# Patient Record
Sex: Male | Born: 1965 | Race: White | Hispanic: No | Marital: Married | Smoking: Former smoker
Health system: Southern US, Community
[De-identification: ages and names within clinical notes are randomized; demographics above are authoritative.]

## PROBLEM LIST (undated history)

## (undated) DIAGNOSIS — I10 Essential (primary) hypertension: Secondary | ICD-10-CM

## (undated) DIAGNOSIS — D649 Anemia, unspecified: Secondary | ICD-10-CM

## (undated) DIAGNOSIS — K7400 Hepatic fibrosis, unspecified: Secondary | ICD-10-CM

## (undated) DIAGNOSIS — I2510 Atherosclerotic heart disease of native coronary artery without angina pectoris: Secondary | ICD-10-CM

## (undated) HISTORY — PX: OTHER SURGICAL HISTORY: SHX169

## (undated) HISTORY — PX: TONSILLECTOMY: SUR1361

## (undated) HISTORY — PX: WHIPPLE PROCEDURE: SHX2667

## (undated) HISTORY — PX: COLONOSCOPY: SHX174

---

## 2007-03-02 ENCOUNTER — Emergency Department (HOSPITAL_COMMUNITY): Admission: EM | Admit: 2007-03-02 | Discharge: 2007-03-02 | Payer: Self-pay | Admitting: Emergency Medicine

## 2012-10-08 ENCOUNTER — Other Ambulatory Visit: Payer: Self-pay | Admitting: Internal Medicine

## 2012-10-08 DIAGNOSIS — R748 Abnormal levels of other serum enzymes: Secondary | ICD-10-CM

## 2012-10-22 ENCOUNTER — Ambulatory Visit
Admission: RE | Admit: 2012-10-22 | Discharge: 2012-10-22 | Disposition: A | Discharge status: Home / Self Care | Payer: No Typology Code available for payment source | Source: Ambulatory Visit | Attending: Internal Medicine | Admitting: Internal Medicine

## 2012-10-22 DIAGNOSIS — R748 Abnormal levels of other serum enzymes: Secondary | ICD-10-CM

## 2012-10-31 ENCOUNTER — Other Ambulatory Visit: Payer: Self-pay | Admitting: Gastroenterology

## 2012-10-31 ENCOUNTER — Encounter (HOSPITAL_COMMUNITY): Payer: Self-pay | Admitting: *Deleted

## 2012-10-31 ENCOUNTER — Encounter (HOSPITAL_COMMUNITY): Payer: Self-pay | Admitting: Pharmacy Technician

## 2012-10-31 NOTE — Progress Notes (Signed)
Lab work from [facility]-10/02/2012-CMET,CBC,LIPID,A1C, MICROALBUMIN/CREATININE,U/A:  10/08/2012 HEPA AB,IGM,HEP A AB,TOTAL.  04/27/2010-Stress test from [facility] 04/09/2010- Echocardiogram from [facility]    ALL ON CHART!

## 2012-11-02 ENCOUNTER — Ambulatory Visit (HOSPITAL_COMMUNITY): Payer: No Typology Code available for payment source

## 2012-11-02 ENCOUNTER — Encounter (HOSPITAL_COMMUNITY): Payer: Self-pay | Admitting: Anesthesiology

## 2012-11-02 ENCOUNTER — Ambulatory Visit (HOSPITAL_COMMUNITY): Payer: No Typology Code available for payment source | Admitting: Anesthesiology

## 2012-11-02 ENCOUNTER — Encounter (HOSPITAL_COMMUNITY)
Admission: RE | Disposition: A | Discharge status: Home / Self Care | Payer: Self-pay | Source: Ambulatory Visit | Attending: Gastroenterology

## 2012-11-02 ENCOUNTER — Ambulatory Visit (HOSPITAL_COMMUNITY)
Admission: RE | Admit: 2012-11-02 | Discharge: 2012-11-02 | Disposition: A | Discharge status: Home / Self Care | Payer: No Typology Code available for payment source | Source: Ambulatory Visit | Attending: Gastroenterology | Admitting: Gastroenterology

## 2012-11-02 ENCOUNTER — Encounter (HOSPITAL_COMMUNITY): Payer: Self-pay

## 2012-11-02 DIAGNOSIS — I2510 Atherosclerotic heart disease of native coronary artery without angina pectoris: Secondary | ICD-10-CM | POA: Insufficient documentation

## 2012-11-02 DIAGNOSIS — C250 Malignant neoplasm of head of pancreas: Secondary | ICD-10-CM

## 2012-11-02 DIAGNOSIS — I10 Essential (primary) hypertension: Secondary | ICD-10-CM | POA: Insufficient documentation

## 2012-11-02 DIAGNOSIS — E119 Type 2 diabetes mellitus without complications: Secondary | ICD-10-CM | POA: Insufficient documentation

## 2012-11-02 DIAGNOSIS — K838 Other specified diseases of biliary tract: Secondary | ICD-10-CM | POA: Insufficient documentation

## 2012-11-02 DIAGNOSIS — K869 Disease of pancreas, unspecified: Secondary | ICD-10-CM | POA: Insufficient documentation

## 2012-11-02 DIAGNOSIS — R748 Abnormal levels of other serum enzymes: Secondary | ICD-10-CM | POA: Insufficient documentation

## 2012-11-02 HISTORY — DX: Anemia, unspecified: D64.9

## 2012-11-02 HISTORY — DX: Essential (primary) hypertension: I10

## 2012-11-02 HISTORY — DX: Atherosclerotic heart disease of native coronary artery without angina pectoris: I25.10

## 2012-11-02 HISTORY — PX: EUS: SHX5427

## 2012-11-02 HISTORY — PX: ERCP: SHX5425

## 2012-11-02 LAB — GLUCOSE, CAPILLARY: Glucose-Capillary: 204 mg/dL — ABNORMAL HIGH (ref 70–99)

## 2012-11-02 SURGERY — UPPER ENDOSCOPIC ULTRASOUND (EUS) LINEAR
Anesthesia: General

## 2012-11-02 MED ORDER — ONDANSETRON HCL 4 MG/2ML IJ SOLN: 4.0000 mg | Freq: Once | INTRAMUSCULAR | Status: DC | PRN

## 2012-11-02 MED ORDER — SUCCINYLCHOLINE CHLORIDE 20 MG/ML IJ SOLN
INTRAMUSCULAR | Status: DC | PRN
  Administered 2012-11-02: 140 mg via INTRAVENOUS

## 2012-11-02 MED ORDER — FENTANYL CITRATE 0.05 MG/ML IJ SOLN
INTRAMUSCULAR | Status: DC | PRN
  Administered 2012-11-02 (×2): 50 ug via INTRAVENOUS
  Administered 2012-11-02 (×2): 25 ug via INTRAVENOUS

## 2012-11-02 MED ORDER — MIDAZOLAM HCL 5 MG/5ML IJ SOLN
INTRAMUSCULAR | Status: DC | PRN
  Administered 2012-11-02 (×2): 0.5 mg via INTRAVENOUS
  Administered 2012-11-02: 1 mg via INTRAVENOUS

## 2012-11-02 MED ORDER — IOHEXOL 300 MG/ML  SOLN
100.0000 mL | Freq: Once | INTRAMUSCULAR | Status: AC | PRN
  Administered 2012-11-02: 100 mL via INTRAVENOUS

## 2012-11-02 MED ORDER — FENTANYL CITRATE 0.05 MG/ML IJ SOLN
25.0000 ug | Freq: Once | INTRAMUSCULAR | Status: AC
  Administered 2012-11-02: 25 ug via INTRAVENOUS

## 2012-11-02 MED ORDER — LIDOCAINE HCL (CARDIAC) 20 MG/ML IV SOLN
INTRAVENOUS | Status: DC | PRN
  Administered 2012-11-02: 30 mg via INTRAVENOUS

## 2012-11-02 MED ORDER — SUCCINYLCHOLINE CHLORIDE 20 MG/ML IJ SOLN: INTRAMUSCULAR | Status: DC | PRN

## 2012-11-02 MED ORDER — LACTATED RINGERS IV SOLN
INTRAVENOUS | Status: DC
  Administered 2012-11-02: 1000 mL via INTRAVENOUS

## 2012-11-02 MED ORDER — FENTANYL CITRATE 0.05 MG/ML IJ SOLN
INTRAMUSCULAR | Status: AC
  Filled 2012-11-02: qty 2

## 2012-11-02 MED ORDER — ONDANSETRON HCL 4 MG/2ML IJ SOLN
INTRAMUSCULAR | Status: DC | PRN
  Administered 2012-11-02: 4 mg via INTRAVENOUS

## 2012-11-02 MED ORDER — PROPOFOL INFUSION 10 MG/ML OPTIME
INTRAVENOUS | Status: DC | PRN
  Administered 2012-11-02: 200 mL via INTRAVENOUS
  Administered 2012-11-02 (×3): 10 mL via INTRAVENOUS

## 2012-11-02 MED ORDER — SODIUM CHLORIDE 0.9 % IV SOLN: INTRAVENOUS | Status: DC

## 2012-11-02 MED ORDER — KETAMINE HCL 10 MG/ML IJ SOLN
INTRAMUSCULAR | Status: DC | PRN
  Administered 2012-11-02 (×2): 10 mg via INTRAVENOUS

## 2012-11-02 MED ORDER — LACTATED RINGERS IV SOLN
INTRAVENOUS | Status: DC | PRN
  Administered 2012-11-02 (×2): via INTRAVENOUS

## 2012-11-02 MED ORDER — MEPERIDINE HCL 100 MG/ML IJ SOLN: 6.2500 mg | INTRAMUSCULAR | Status: DC | PRN

## 2012-11-02 MED ORDER — SODIUM CHLORIDE 0.9 % IV SOLN
INTRAVENOUS | Status: DC | PRN
  Administered 2012-11-02: 11:00:00

## 2012-11-02 MED ORDER — DEXAMETHASONE SODIUM PHOSPHATE 4 MG/ML IJ SOLN
INTRAMUSCULAR | Status: DC | PRN
  Administered 2012-11-02: 10 mg via INTRAVENOUS

## 2012-11-02 NOTE — Progress Notes (Signed)
Patient back from Ct Scan. Dr. Elnoria Howard aware. Per Dr. Elnoria Howard patient is ok to go home. Patient is requesting to wait until CT results are back before going home. Dr. Elnoria Howard notified.

## 2012-11-02 NOTE — Progress Notes (Signed)
Pt has gone to CT scan. Will return upon CT completion

## 2012-11-02 NOTE — Anesthesia Preprocedure Evaluation (Addendum)
Anesthesia Evaluation  Patient identified by MRN, date of birth, ID band Patient awake    Reviewed: Allergy & Precautions, H&P , NPO status , Patient's Chart, lab work & pertinent test results  Airway Mallampati: II TM Distance: >3 FB Neck ROM: Full    Dental  (+) Dental Advisory Given and Teeth Intact   Pulmonary former smoker,  breath sounds clear to auscultation        Cardiovascular hypertension, Pt. on medications + CAD Rhythm:Regular Rate:Normal     Neuro/Psych negative neurological ROS  negative psych ROS   GI/Hepatic negative GI ROS, Neg liver ROS,   Endo/Other  diabetes, Type 2  Renal/GU negative Renal ROS     Musculoskeletal negative musculoskeletal ROS (+)   Abdominal   Peds  Hematology negative hematology ROS (+)   Anesthesia Other Findings   Reproductive/Obstetrics                         Anesthesia Physical Anesthesia Plan  ASA: II  Anesthesia Plan: General   Post-op Pain Management:    Induction: Intravenous  Airway Management Planned: Oral ETT  Additional Equipment:   Intra-op Plan:   Post-operative Plan: Extubation in OR  Informed Consent: I have reviewed the patients History and Physical, chart, labs and discussed the procedure including the risks, benefits and alternatives for the proposed anesthesia with the patient or authorized representative who has indicated his/her understanding and acceptance.   Dental advisory given  Plan Discussed with: CRNA  Anesthesia Plan Comments:         Anesthesia Quick Evaluation

## 2012-11-02 NOTE — Progress Notes (Signed)
Patient resting prone, chest rolls in place , extremities pressure point pads in place, head  cradle in place.

## 2012-11-02 NOTE — Transfer of Care (Signed)
Immediate Anesthesia Transfer of Care Note  Patient: [name] [name]  Procedure(s) Performed: Procedure(s): UPPER ENDOSCOPIC ULTRASOUND (EUS) LINEAR (N/A) ENDOSCOPIC RETROGRADE CHOLANGIOPANCREATOGRAPHY (ERCP) (N/A)  Patient Location: PACU  Anesthesia Type:General  Level of Consciousness: awake, oriented and patient cooperative  Airway & Oxygen Therapy: Patient Spontanous Breathing and Patient connected to nasal cannula oxygen  Post-op Assessment: Report given to PACU RN and Post -op Vital signs reviewed and stable  Post vital signs: stable  Complications: No apparent anesthesia complications

## 2012-11-02 NOTE — Anesthesia Postprocedure Evaluation (Signed)
Anesthesia Post Note  Patient: [name] [name]  Procedure(s) Performed: Procedure(s) (LRB): UPPER ENDOSCOPIC ULTRASOUND (EUS) LINEAR (N/A) ENDOSCOPIC RETROGRADE CHOLANGIOPANCREATOGRAPHY (ERCP) (N/A)  Anesthesia type: General  Patient location: PACU  Post pain: Pain level controlled  Post assessment: Post-op Vital signs reviewed  Last Vitals: BP 164/107  Pulse 86  Temp(Src) 36.6 C (Oral)  Resp 10  Ht 6' (1.829 m)  Wt 228 lb (103.42 kg)  BMI 30.92 kg/m2  SpO2 97%  Post vital signs: Reviewed  Level of consciousness: sedated  Complications: No apparent anesthesia complications

## 2012-11-02 NOTE — Progress Notes (Signed)
Patient is waiting for CT results. States pain in Abdomen is a constant aching pain of 1-2. Patient is up to bathroom as present.

## 2012-11-02 NOTE — Progress Notes (Signed)
Patient states pain is now just a constant aching. Dr. Elnoria Howard is aware and has spoken with patient.  Dr. Elnoria Howard has ordered CT, notified Ct and they are coming to pick patient up.

## 2012-11-02 NOTE — Op Note (Signed)
[facility] [address] [place], [address]   ENDOSCOPIC ULTRASOUND PROCEDURE REPORT  PATIENT: [name] [name]  MR#: [id number] BIRTHDATE: December 19, 1965  GENDER: Male ENDOSCOPIST: Jeani Hawking, MD REFERRED BY:  Sharrell Ku, M.D. PROCEDURE DATE:  11/02/2012 PROCEDURE:   Upper EUS w/FNA/ERCP with stent placement ASA CLASS:      Class II INDICATIONS:   1.  Abnormal ultrasound. MEDICATIONS: MAC sedation, administered by CRNA  DESCRIPTION OF PROCEDURE:   After the risks benefits and alternatives of the procedure were  explained, informed consent was obtained. The patient was then placed in the left, lateral, decubitus postion and IV sedation was administered. Throughout the procedure, the patients blood pressure, pulse and oxygen saturations were monitored continuously.  Under direct visualization, the     endoscope was introduced through the mouth and advanced to the second portion of the duodenum .  Water was used as necessary to provide an acoustic interface.  Upon completion of the imaging, water was removed and the patient was sent to the recovery room in satisfactory condition.   FINDINGS: With the linear EUS scope a large 4.7 x 3.2 cm pancreatic head mass was identified.  The pancreatic body and tail also appeared to be irregular, reminiscent of chronic pancreatitis.  The mass was irregularly bordered and hypoechoic.  the cut off of the CBD was noted and the CBD was measured to be 1 cm.  The mass abutted the SMV and the Portal Confluence, but I was not able to discern if there was clear invasion at the Portal Confluence.  Five passes with the 25 gauge FNA needle was performed, but the preliminary results with the Quick Stain was poor.  At that time point, the decision was made to convert the procedure to the ERCP for stent placement.  Despite the large mass, the CBD was able to be cannulated with ease.  The guidewire was secured in the right intrahepatic  ducts.  Contrast injection revealed a cut off sign in the CBD and the length of the stenosis was approximately 6 cm. Proximal to the cut off point the CBD was markedly dilated.  An 8.5 Fr x 7 cm stent was inserted with moderate difficulty.  Good bile drainage was noted after deployment of the stent and good placement was documented with the fluroscopy.  Subsequently a repeat EUS with FNA was performed using a 22 gauge needle.  Five passes were performed and then the procedure was terminated.   The scope was then withdrawn from the patient and the procedure completed.  COMPLICATIONS: There were no complications. ENDOSCOPIC VISUALIZATION:  ULTRASONIC VISUALIZATION:  STAGING:  ENDOSCOPIC IMPRESSION: 1) Large pancreatic head mass s/p FNA. 2) S/p 8.5 Fr x 7 cm biliary stent placement.  RECOMMENDATIONS: 1) Await FNA results.  _______________________________ eSignedJeani Hawking, MD 11/02/2012 11:30 AM   CC: Ritta Slot, M.D.

## 2012-11-02 NOTE — Progress Notes (Signed)
Patient complaining of pain 5/10 in Abdomen. Dr. Elnoria Howard notified. Dr, Elnoria Howard saw poatient, pressed on Abdomed, and spoke with patient. Ordered Fentanyl Iv to be given. Order entered. Fentanyl given. Will monitor patients pain level and notify Dr. Caryl Ada.

## 2012-11-02 NOTE — H&P (Signed)
Reason for Consult: Dilated CBD and abnormal liver enzymes Referring Physician: Ritta Slot, M.D.  [name] [name] HPI: This is a 47 year old male who was found to have persistently abnormal liver enzymes by Dr. Wylene Simmer after discontinuation of his antihypertensive.  Initially he complained of RUQ pain and it was felt to be secondary to his antihypertensive.  However, after discontinuing the antihypertensive his liver enzymes remained elevated.  A RUQ U/S was performed and he was noted to have a CBD at 13.7 mm and his gallbladder was distended with sludge.  Currently he does not have any pain and he is now referred for further evaluation and treatment.  Past Medical History  Diagnosis Date  . Hypertension   . Coronary artery disease   . Diabetes mellitus without complication     diet controlled  . Anemia     Past Surgical History  Procedure Laterality Date  . Tonsillectomy      as child  . Arthroscopic knee surgery      left-20 yrs. ago    History reviewed. No pertinent family history.  Social History:  reports that he quit smoking about 20 years ago. He does not have any smokeless tobacco history on file. He reports that  drinks alcohol. He reports that he does not use illicit drugs.  Allergies:  Allergies  Allergen Reactions  . Penicillins     unknown    Medications:  Scheduled:  Continuous: . sodium chloride    . lactated ringers 1,000 mL (11/02/12 4098)    Results for orders placed during the hospital encounter of 11/02/12 (from the past 24 hour(s))  GLUCOSE, CAPILLARY     Status: Abnormal   Collection Time    11/02/12  8:28 AM      Result Value Range   Glucose-Capillary 204 (*) 70 - 99 mg/dL     No results found.  ROS:  As stated above in the HPI otherwise negative.  Blood pressure 155/106, pulse 86, resp. rate 12, height 6' (1.829 m), weight 228 lb (103.42 kg), SpO2 97.00%.    PE: Gen: NAD, Alert and Oriented HEENT:  [place]/AT, EOMI Neck: Supple, no  LAD Lungs: CTA Bilaterally CV: RRR without M/G/R ABM: Soft, NTND, +BS Ext: No C/C/E  Assessment/Plan: 1) Dilated CBD. 2) Abnormal liver enzymes.   I will perform an EUS to evaluate the head of the pancreas.  The ultrasound was not able to evaluate the head of the pancreas well.  I explained the risks of bleeding, infection, and pancreatitis with the patient.  He acknowledges these risks and wishes to proceed.  Plan: 1) EUS/ERCP.  [name] [name] D 11/02/2012, 9:07 AM

## 2012-11-05 ENCOUNTER — Encounter (HOSPITAL_COMMUNITY): Payer: Self-pay | Admitting: Gastroenterology

## 2014-11-16 ENCOUNTER — Encounter (HOSPITAL_COMMUNITY): Payer: Self-pay

## 2014-11-16 ENCOUNTER — Telehealth: Payer: Self-pay | Admitting: Gastroenterology

## 2014-11-16 ENCOUNTER — Emergency Department (HOSPITAL_COMMUNITY)
Admission: EM | Admit: 2014-11-16 | Discharge: 2014-11-16 | Disposition: A | Discharge status: Home / Self Care | Payer: BLUE CROSS/BLUE SHIELD | Attending: Emergency Medicine | Admitting: Emergency Medicine

## 2014-11-16 ENCOUNTER — Emergency Department (HOSPITAL_COMMUNITY): Payer: BLUE CROSS/BLUE SHIELD

## 2014-11-16 DIAGNOSIS — Z880 Allergy status to penicillin: Secondary | ICD-10-CM | POA: Diagnosis not present

## 2014-11-16 DIAGNOSIS — R509 Fever, unspecified: Secondary | ICD-10-CM | POA: Insufficient documentation

## 2014-11-16 DIAGNOSIS — R197 Diarrhea, unspecified: Secondary | ICD-10-CM | POA: Diagnosis present

## 2014-11-16 DIAGNOSIS — I10 Essential (primary) hypertension: Secondary | ICD-10-CM | POA: Diagnosis not present

## 2014-11-16 DIAGNOSIS — R590 Localized enlarged lymph nodes: Secondary | ICD-10-CM | POA: Diagnosis not present

## 2014-11-16 DIAGNOSIS — Z862 Personal history of diseases of the blood and blood-forming organs and certain disorders involving the immune mechanism: Secondary | ICD-10-CM | POA: Diagnosis not present

## 2014-11-16 DIAGNOSIS — Z87891 Personal history of nicotine dependence: Secondary | ICD-10-CM | POA: Insufficient documentation

## 2014-11-16 DIAGNOSIS — K529 Noninfective gastroenteritis and colitis, unspecified: Secondary | ICD-10-CM | POA: Insufficient documentation

## 2014-11-16 DIAGNOSIS — I2510 Atherosclerotic heart disease of native coronary artery without angina pectoris: Secondary | ICD-10-CM | POA: Insufficient documentation

## 2014-11-16 HISTORY — DX: Hepatic fibrosis, unspecified: K74.00

## 2014-11-16 HISTORY — DX: Hepatic fibrosis: K74.0

## 2014-11-16 LAB — COMPREHENSIVE METABOLIC PANEL
ALBUMIN: 3.4 g/dL — AB (ref 3.5–5.0)
ALK PHOS: 117 U/L (ref 38–126)
ALT: 30 U/L (ref 17–63)
AST: 34 U/L (ref 15–41)
Anion gap: 9 (ref 5–15)
BILIRUBIN TOTAL: 0.9 mg/dL (ref 0.3–1.2)
BUN: 7 mg/dL (ref 6–20)
CALCIUM: 8.8 mg/dL — AB (ref 8.9–10.3)
CO2: 24 mmol/L (ref 22–32)
Chloride: 98 mmol/L — ABNORMAL LOW (ref 101–111)
Creatinine, Ser: 0.88 mg/dL (ref 0.61–1.24)
GFR calc Af Amer: >60 mL/min (ref >60)
GLUCOSE: 255 mg/dL — AB (ref 65–99)
POTASSIUM: 3.4 mmol/L — AB (ref 3.5–5.1)
Sodium: 131 mmol/L — ABNORMAL LOW (ref 135–145)
TOTAL PROTEIN: 7.7 g/dL (ref 6.5–8.1)

## 2014-11-16 LAB — CBC
HCT: 41.7 % (ref 39.0–52.0)
Hemoglobin: 14 g/dL (ref 13.0–17.0)
MCH: 29.1 pg (ref 26.0–34.0)
MCHC: 33.6 g/dL (ref 30.0–36.0)
MCV: 86.7 fL (ref 78.0–100.0)
PLATELETS: 205 10*3/uL (ref 150–400)
RBC: 4.81 MIL/uL (ref 4.22–5.81)
RDW: 13.6 % (ref 11.5–15.5)
WBC: 16.8 10*3/uL — AB (ref 4.0–10.5)

## 2014-11-16 LAB — URINALYSIS, ROUTINE W REFLEX MICROSCOPIC
BILIRUBIN URINE: NEGATIVE
Glucose, UA: 500 mg/dL — AB
HGB URINE DIPSTICK: NEGATIVE
KETONES UR: NEGATIVE mg/dL
Leukocytes, UA: NEGATIVE
NITRITE: NEGATIVE
PROTEIN: NEGATIVE mg/dL
Specific Gravity, Urine: 1.011 (ref 1.005–1.030)
UROBILINOGEN UA: 0.2 mg/dL (ref 0.0–1.0)
pH: 6 (ref 5.0–8.0)

## 2014-11-16 LAB — LIPASE, BLOOD: Lipase: <10 U/L — ABNORMAL LOW (ref 22–51)

## 2014-11-16 LAB — I-STAT CG4 LACTIC ACID, ED: LACTIC ACID, VENOUS: 2.05 mmol/L — AB (ref 0.5–2.0)

## 2014-11-16 MED ORDER — MORPHINE SULFATE (PF) 4 MG/ML IV SOLN
4.0000 mg | Freq: Once | INTRAVENOUS | Status: DC
  Filled 2014-11-16: qty 1

## 2014-11-16 MED ORDER — OXYCODONE HCL 5 MG PO TABS: 5.0000 mg | ORAL_TABLET | ORAL | Status: AC | PRN

## 2014-11-16 MED ORDER — IOHEXOL 300 MG/ML  SOLN
50.0000 mL | Freq: Once | INTRAMUSCULAR | Status: AC | PRN
  Administered 2014-11-16: 50 mL via ORAL

## 2014-11-16 MED ORDER — ONDANSETRON 4 MG PO TBDP: 4.0000 mg | ORAL_TABLET | Freq: Three times a day (TID) | ORAL | Status: AC | PRN

## 2014-11-16 MED ORDER — SODIUM CHLORIDE 0.9 % IV BOLUS (SEPSIS)
1000.0000 mL | Freq: Once | INTRAVENOUS | Status: AC
  Administered 2014-11-16: 1000 mL via INTRAVENOUS

## 2014-11-16 MED ORDER — IOHEXOL 300 MG/ML  SOLN
100.0000 mL | Freq: Once | INTRAMUSCULAR | Status: AC | PRN
  Administered 2014-11-16: 100 mL via INTRAVENOUS

## 2014-11-16 MED ORDER — SULFAMETHOXAZOLE-TRIMETHOPRIM 800-160 MG PO TABS: 1.0000 | ORAL_TABLET | Freq: Two times a day (BID) | ORAL | Status: AC

## 2014-11-16 MED ORDER — IBUPROFEN 800 MG PO TABS
800.0000 mg | ORAL_TABLET | Freq: Once | ORAL | Status: AC
  Administered 2014-11-16: 800 mg via ORAL
  Filled 2014-11-16: qty 1

## 2014-11-16 MED ORDER — ONDANSETRON HCL 4 MG/2ML IJ SOLN
4.0000 mg | Freq: Once | INTRAMUSCULAR | Status: DC
  Filled 2014-11-16: qty 2

## 2014-11-16 MED ORDER — METRONIDAZOLE 500 MG PO TABS: 500.0000 mg | ORAL_TABLET | Freq: Two times a day (BID) | ORAL | Status: AC

## 2014-11-16 NOTE — Discharge Instructions (Signed)
Take the prescribed medication as directed.   Follow-up with Dr. Benson Norway-- call to make appt. Return to the ED for new or worsening symptoms.

## 2014-11-16 NOTE — ED Notes (Signed)
Pt ambulated to BR and returned to room with sudden onset of bradycardia on pulse ox, feeling of warmth, diaphoretic, central chest pain, nausea without radiation and dizziness. Radial pulse is irregular at 68bpm.

## 2014-11-16 NOTE — ED Provider Notes (Signed)
CSN: [id number]     Arrival date & time 11/16/14  1610 History   First MD Initiated Contact with Patient 11/16/14 1644     Chief Complaint  Patient presents with  . Fever  . Nausea  . Diarrhea     (Consider location/radiation/quality/duration/timing/severity/associated sxs/prior Treatment) The history is provided by the patient and medical records.    This is a 49 year old male with hx of HTN, CAD, anemia, history of pancreatic tumor status post Whipple, presenting to the ED for fever, nausea, and diarrhea. Patient had a colonoscopy approximately one month ago with Dr. Almyra Free. He had 2 biopsies done at that time and there was diverticulosis noted on colonoscopy. He states the next day he did develop a low-grade fever and was started on antibiotics for possible diverticulitis. After taking the antibiotic symptoms seem to get better, however symptoms have since returned. He states over the past week he has been having watery diarrhea and nausea as well as some lower abdominal pain. He states eating and drinking helped with his nausea, however makes the pain worse. States he has has some occasional rectal bleeding which he attributes to multiple episodes of diarrhea. He denies any gross blood in stools.  Patient states throughout the day today he has remained febrile with chills, current temp 103.49F.  His wife called GI office today and was encouraged to come here for emergent evaluation.  Past Medical History  Diagnosis Date  . Hypertension   . Coronary artery disease   . Anemia   . Liver fibrosis    Past Surgical History  Procedure Laterality Date  . Tonsillectomy      as child  . Arthroscopic knee surgery      left-20 yrs. ago  . Eus N/A 11/02/2012    Procedure: UPPER ENDOSCOPIC ULTRASOUND (EUS) LINEAR;  Surgeon: Beryle Beams, MD;  Location: [facility];  Service: Endoscopy;  Laterality: N/A;  . Ercp N/A 11/02/2012    Procedure: ENDOSCOPIC RETROGRADE CHOLANGIOPANCREATOGRAPHY (ERCP);   Surgeon: Beryle Beams, MD;  Location: [facility];  Service: Endoscopy;  Laterality: N/A;  . Whipple procedure    . Colonoscopy     Family History  Problem Relation Age of Onset  . Stroke Mother   . Hypertension Mother   . Heart failure Mother   . Stroke Father    Social History  Substance Use Topics  . Smoking status: Former Smoker    Quit date: 10/09/1992  . Smokeless tobacco: None  . Alcohol Use: Yes     Comment: rarely    Review of Systems  Constitutional: Positive for fever.  Gastrointestinal: Positive for abdominal pain and diarrhea.  All other systems reviewed and are negative.     Allergies  Ciprofloxacin; Losartan; and Penicillins  Home Medications   Prior to Admission medications   Medication Sig Start Date End Date Taking? Authorizing Provider  ibuprofen (ADVIL,MOTRIN) 200 MG tablet Take 200 mg by mouth every 6 (six) hours as needed for pain.    Historical Provider, MD   BP 143/86 mmHg  Pulse 121  Temp(Src) 103.1 F (39.5 C) (Oral)  Resp 22  SpO2 100%   Physical Exam  Constitutional: He is oriented to person, place, and time. He appears well-developed and well-nourished. No distress.  HENT:  Head: Normocephalic and atraumatic.  Mouth/Throat: Oropharynx is clear and moist.  Eyes: Conjunctivae and EOM are normal. Pupils are equal, round, and reactive to light.  Neck: Normal range of motion. Neck supple.  Cardiovascular: Normal  rate, regular rhythm and normal heart sounds.   Pulmonary/Chest: Effort normal and breath sounds normal. No respiratory distress. He has no wheezes.  Abdominal: Soft. Bowel sounds are normal. There is tenderness (mild) in the right lower quadrant, periumbilical area and left lower quadrant. There is no guarding.  Musculoskeletal: Normal range of motion. He exhibits no edema.  Neurological: He is alert and oriented to person, place, and time.  Skin: Skin is warm and dry. He is not diaphoretic.  Psychiatric: He has a normal  mood and affect.  Nursing note and vitals reviewed.   ED Course  Procedures (including critical care time) Labs Review Labs Reviewed  LIPASE, BLOOD - Abnormal; Notable for the following:    Lipase <10 (*)    All other components within normal limits  COMPREHENSIVE METABOLIC PANEL - Abnormal; Notable for the following:    Sodium 131 (*)    Potassium 3.4 (*)    Chloride 98 (*)    Glucose, Bld 255 (*)    Calcium 8.8 (*)    Albumin 3.4 (*)    All other components within normal limits  CBC - Abnormal; Notable for the following:    WBC 16.8 (*)    All other components within normal limits  URINALYSIS, ROUTINE W REFLEX MICROSCOPIC (NOT AT [facility]) - Abnormal; Notable for the following:    Glucose, UA 500 (*)    All other components within normal limits  I-STAT CG4 LACTIC ACID, ED - Abnormal; Notable for the following:    Lactic Acid, Venous 2.05 (*)    All other components within normal limits    Imaging Review Ct Abdomen Pelvis W Contrast  11/16/2014   CLINICAL DATA:  Abdominal pain with diarrhea and low grade fever. Antibiotics administered 10 days ago. Previous Whipple procedure. No history of malignancy. Initial encounter.  EXAM: CT ABDOMEN AND PELVIS WITH CONTRAST  TECHNIQUE: Multidetector CT imaging of the abdomen and pelvis was performed using the standard protocol following bolus administration of intravenous contrast.  CONTRAST:  38mL OMNIPAQUE IOHEXOL 300 MG/ML SOLN, 121mL OMNIPAQUE IOHEXOL 300 MG/ML SOLN  COMPARISON:  Abdominal pelvic CT 11/02/2012.  FINDINGS: Lower chest: Mild right base atelectasis. No significant pleural or pericardial effusion. There are mildly enlarged precordial lymph nodes. Coronary artery calcifications are noted.  Hepatobiliary: Interval cholecystectomy, and by history Whipple procedure. Pneumobilia is present. There is diffuse hepatic steatosis with probable focal sparing anteriorly in the dome of the right hepatic lobe. No suspicious hepatic lesions  identified. The liver is enlarged.  Pancreas: Postsurgical changes consistent with Whipple procedure. The pancreatic tail appears normal. There is no surrounding inflammatory change or pancreatic ductal dilatation.  Spleen: Normal in size without focal abnormality.  Adrenals/Urinary Tract: Both adrenal glands appear normal. Interval enlargement of several intermediate density right renal lesions, the largest anteriorly in the upper pole, measuring 5.2 cm and 19 HU on image 53. There are other smaller lesions in the lower pole which have enlarged. The left kidney demonstrates no suspicious findings. There is no hydronephrosis or bladder abnormality.  Stomach/Bowel: Postsurgical changes within the stomach and proximal small bowel. The distal small bowel, appendix and proximal colon appear normal. The descending and sigmoid colon demonstrates mild wall thickening.  Vascular/Lymphatic: Interval enlargement of multiple abdominal pelvic lymph nodes, primarily within the small bowel mesentery. There is a 1.4 cm node on image 46 within the central mesentery. Mild aortoiliac atherosclerosis.  Reproductive: Mild prostatic enlargement without focal abnormality.  Other: Small umbilical hernia containing only fat.  There is mildly prominent fat in both inguinal canals. No ascites or peritoneal nodularity.  Musculoskeletal: No acute or significant osseous findings. L2 hemangioma noted.  IMPRESSION: 1. Distal colonic diverticulosis with mild wall thickening, possibly representing mild colitis. No evidence of obstruction or perforation. 2. Interval development of multiple enlarged abdominal pelvic lymph nodes, primarily within the mesentery. These lymph nodes could be reactive, although are suspicious for a lymphoproliferative process. Correlate clinically. 3. Interval Whipple procedure for reported benign disease. There has been interval enlargement of the liver which demonstrates diffuse steatosis. 4. Interval enlargement of  several intermediate density right renal lesions. These are incompletely characterized by this examination and may reflect complex cysts. These lesions would be best characterized with abdominal MRI to include pre and post-contrast imaging.   Electronically Signed   By: Richardean Sale M.D.   On: 11/16/2014 19:47   I have personally reviewed and evaluated these images and lab results as part of my medical decision-making.   EKG Interpretation None      MDM   Final diagnoses:  Colitis  Fever, unspecified fever cause  Abdominal lymphadenopathy   49 year old male here with abdominal pain, diarrhea, and fever. Patient is febrile and tachycardic, however nontoxic in appearance. He has some mild lower abdominal and periumbilical tenderness without rebound or guarding. He has had recent colonoscopy and is status post Whipple. Lactate minimally elevated at 2.05 with leukocytosis of 16.8K.  LFT's and renal function preserved, normal lipase.  CT scan was obtained given recent instrumentation-- findings concerning for infectious colitis with lymphadenopathy which may be reactive versus lymphoproliferative process. Results were discussed with patient, he acknowledged understanding. Fever and HR have improved with intervention here.  Patient remains non-toxic in appearance.  Patient tolerating PO without difficulty.  Feel that patient needs antibiotic coverage for infectious colitis. He has allergies to penicillin and ciprofloxacin (anaphylaxis and severe N/V respectively)-- spoke with pharmacy who recommends bactrim and flagyl as alternative therapy.  Patient to follow-up with Dr. Benson Norway-- I have discussed that he may need follow-up imaging to ensure lymphadenopathy has resolved, if not he may need further workup.  Discussed plan with patient, he/she acknowledged understanding and agreed with plan of care.  Return precautions given for new or worsening symptoms.  Case discussed with attending physician, Dr.  Betsey Holiday, who agrees with assessment and plan of care.  Larene Pickett, PA-C 11/16/14 2238  Orpah Greek, MD 11/16/14 [phone]

## 2014-11-16 NOTE — ED Notes (Signed)
Patient was alert, oriented and stable upon discharge. RN went over AVS and patient had no further questions. PA aware of temp upon departure. Told to take motrin if necessary.

## 2014-11-16 NOTE — Telephone Encounter (Signed)
I am covering for Dr. Benson Norway while on-call. I received a phone call from Mr. [name] [name] wife this afternoon. He had a colonoscopy a few weeks ago (no report available), wife states some polyps removed and diverticulosis. She reports he had a fever after that procedure and given Abx for presumed diverticulitis. For the past 2 weeks he has had diarrhea, but now having fevers up to 102, very weak, sleeping most of the day, and back pain. Wife states he acting very differently than his normal state. Advised them to go to the ER for further evaluate for lab workup and figure out what is driving his fevers. Likely needs testing for C Diff. They agreed, I will inform Dr. Benson Norway.

## 2014-11-16 NOTE — ED Notes (Signed)
Pt reported generalized abd pain after meals. Pt reported diarrhea and nausea but no vomiting and dizziness.

## 2014-11-16 NOTE — ED Notes (Signed)
Awake. Verbally responsive. A/O x4. Resp even and unlabored. No audible adventitious breath sounds noted. ABC's intact. IV infusing NS at 989ml/hr without difficulty. Family at bedside.

## 2014-11-16 NOTE — ED Notes (Addendum)
Patient had a colonoscopy 4-5 weeks ago. Patient was placed on antibiotics x 10 days due to low grade fever. Patient baegan having diarrhea, nausea2 days ago. Patient took Aspirin 325 mg aspirin 1 hour ago and has been taking Imodium every 12 hours since Friday. Patient also c/o upper and mid abdominal pain and mid and lower back pain.

## 2014-11-16 NOTE — ED Notes (Signed)
Provider aware of abnormal lactic acid.

## 2014-11-28 IMAGING — US US ABDOMEN LIMITED
1 series · 14 of 25 positions shown · non-contrast
Comparison: None.

CLINICAL DATA: Elevated liver enzymes.

LIMITED ABDOMINAL ULTRASOUND - RIGHT UPPER QUADRANT

[Series 1: us abdomen limited · 0.24mm/px · 14 of 62 slices shown]
[im 1/62]
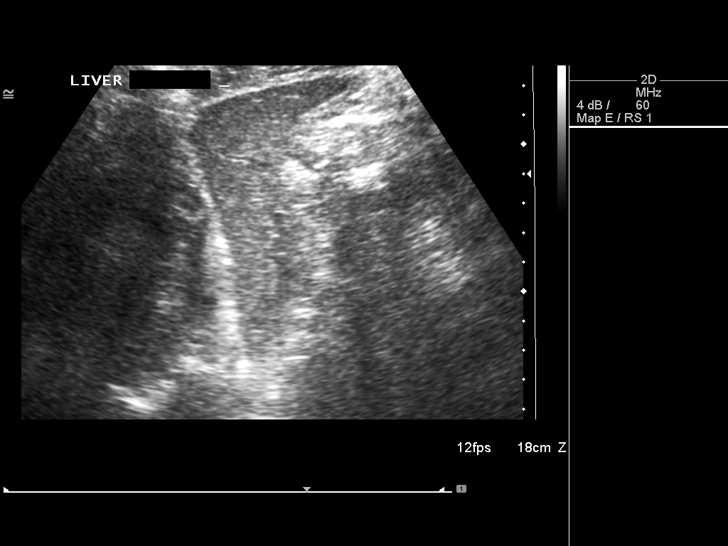
[im 6/62]
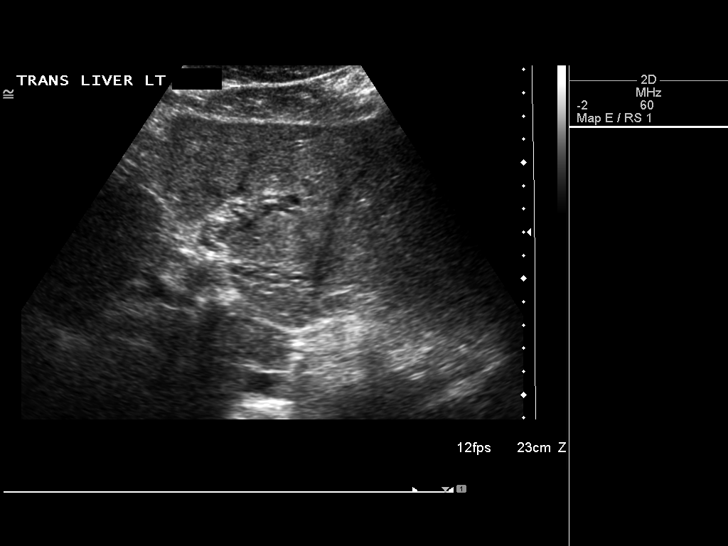
[im 11/62]
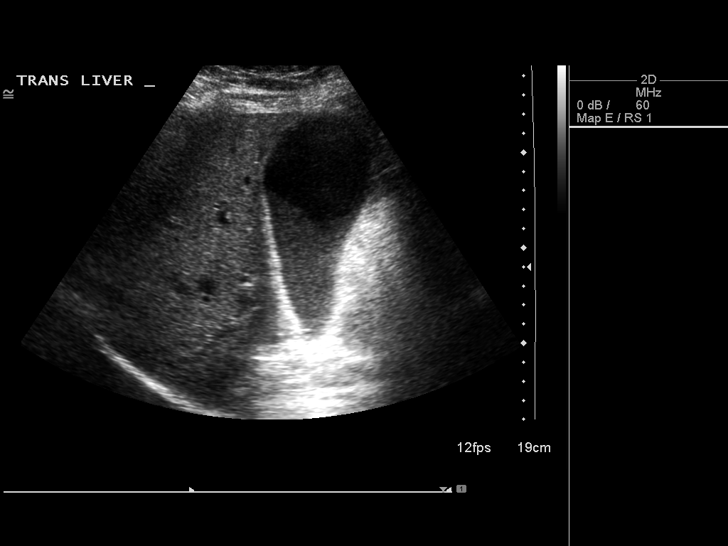
[im 16/62]
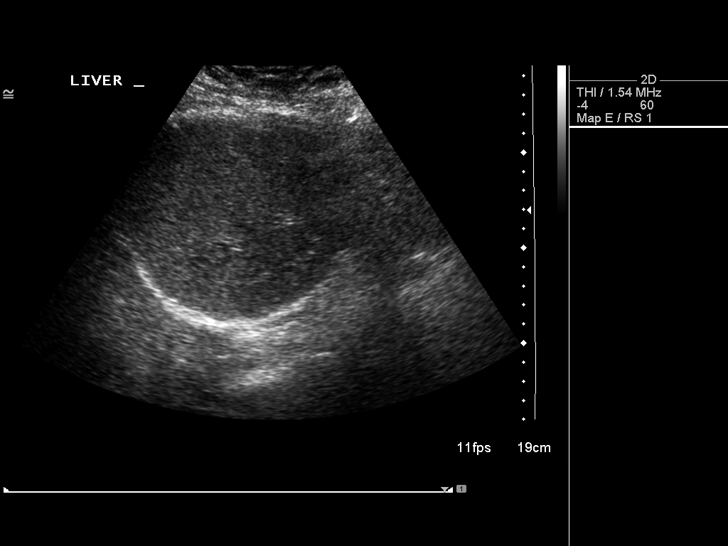
[im 21/62]
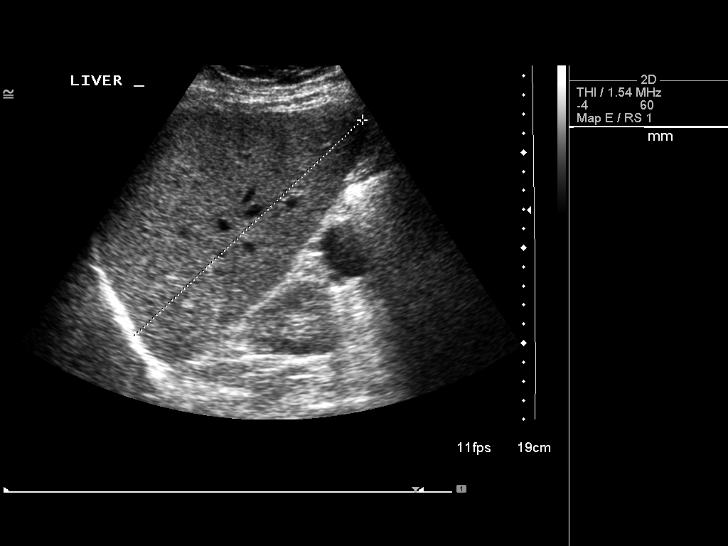
[im 23/62]
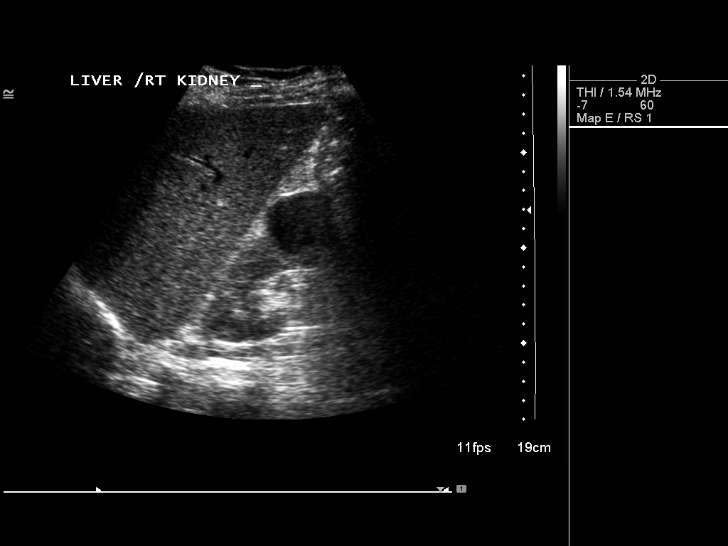
[im 28/62]
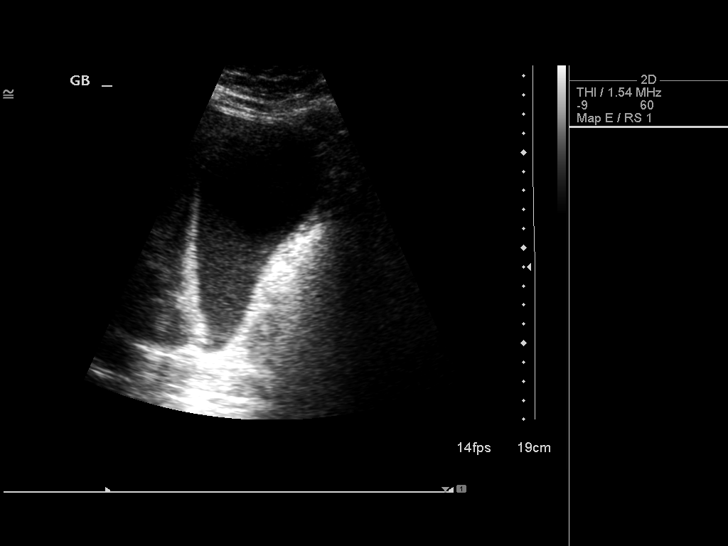
[im 34/62]
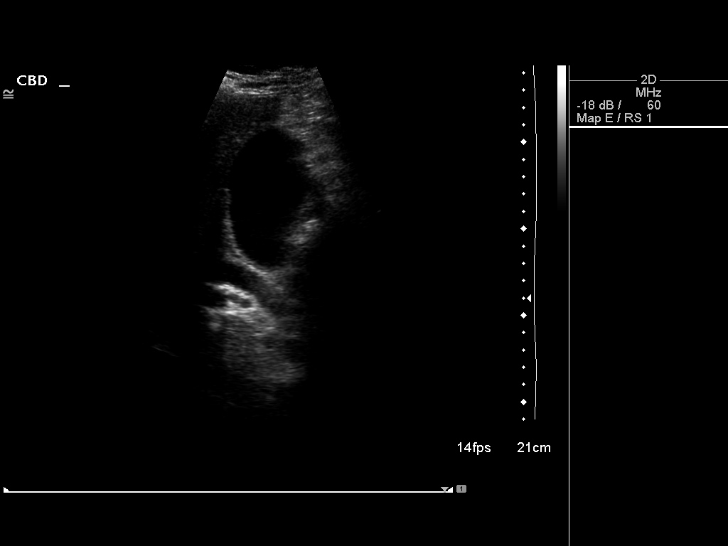
[im 39/62]
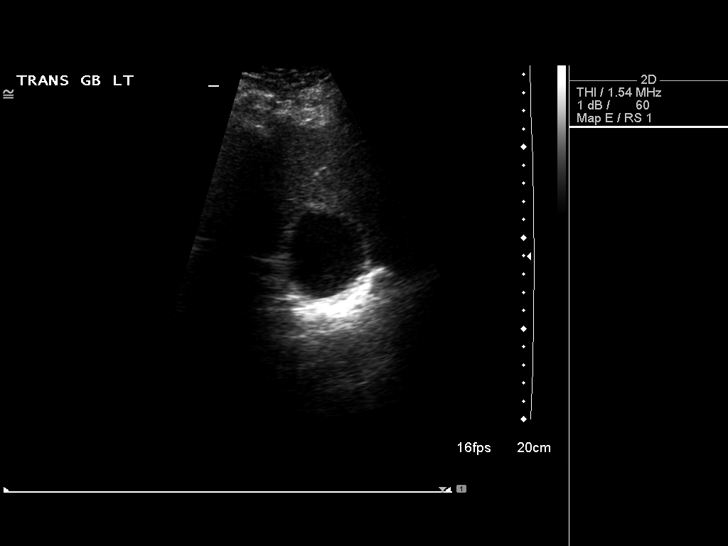
[im 41/62]
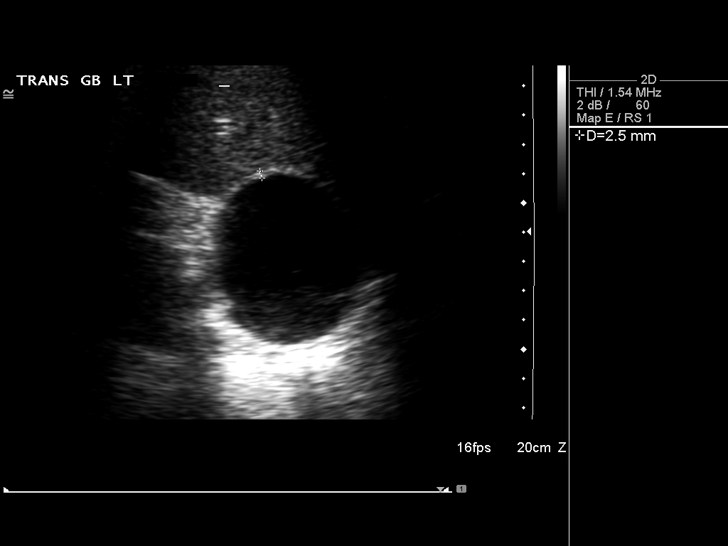
[im 46/62]
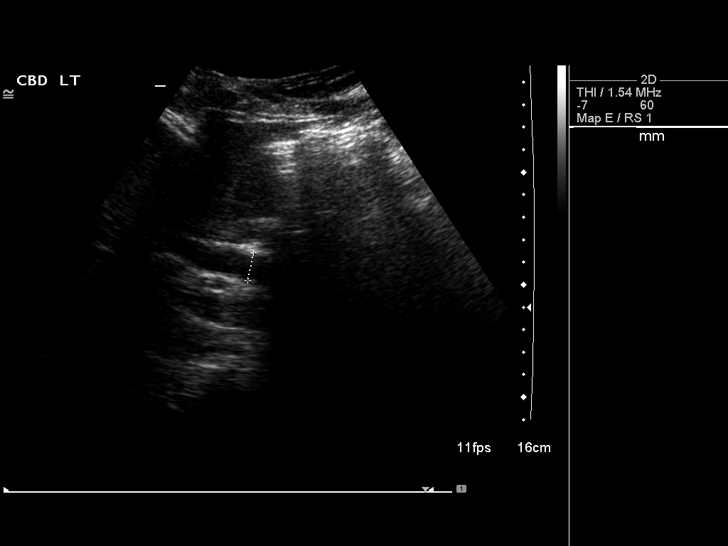
[im 51/62]
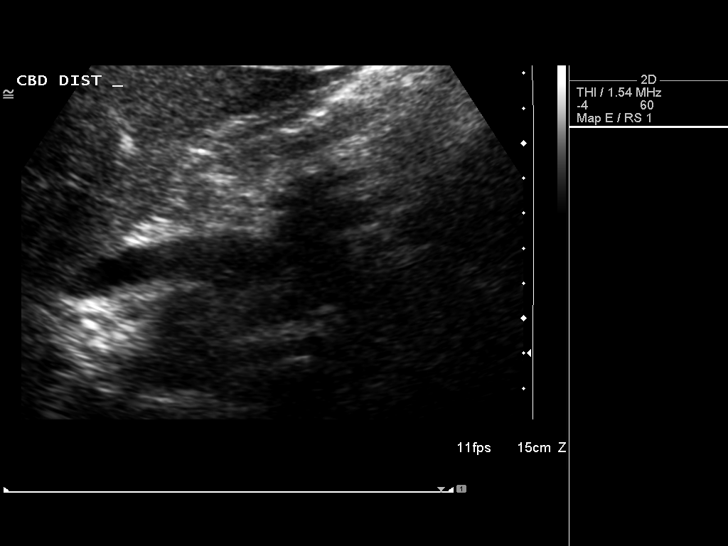
[im 56/62]
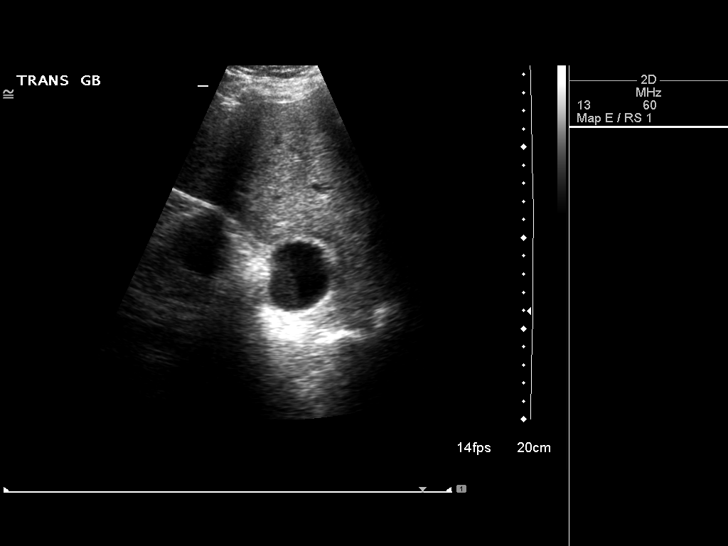
[im 62/62]
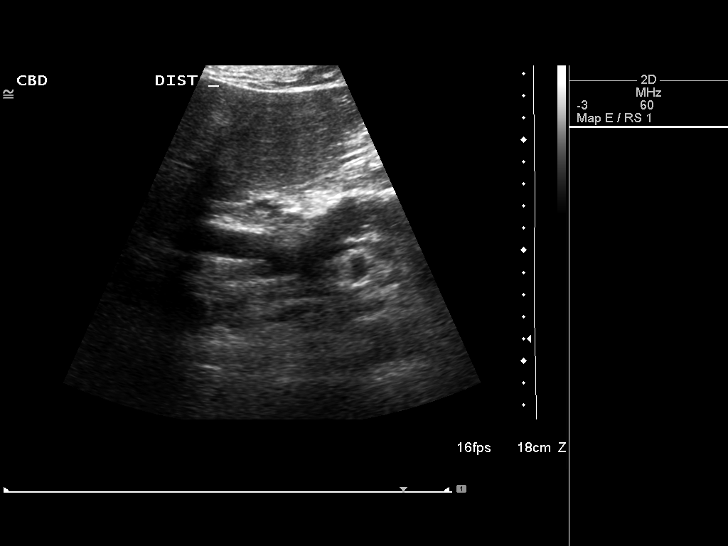

[14 of 25 positions shown; findings below may reference images not displayed]

FINDINGS: Gallbladder:  The gallbladder is distended with prominent sludge.
Negative sonographic Murphy's sign.  Wall thickness is normal.

Common bile duct:  The biliary tree is dilated.  Common bile duct
has a maximal diameter of 13.7 mm.

Liver:  Dilated intrahepatic bile ducts.  Liver parenchyma is
normal.  No focal masses.

Incidental note is made of a 4.1 cm simple appearing cyst on the
right kidney.
IMPRESSION: Dilated intra and extrahepatic bile ducts.  Sludge in the
gallbladder.  The possibility of a distal common bile duct stone or
pancreatic mass should be considered.

These results will be called to the ordering clinician or
representative by the Radiologist Assistant, and communication
documented in the PACS Dashboard.

## 2015-04-28 ENCOUNTER — Other Ambulatory Visit: Payer: Self-pay | Admitting: Gastroenterology

## 2015-04-28 DIAGNOSIS — R1084 Generalized abdominal pain: Secondary | ICD-10-CM

## 2015-04-30 ENCOUNTER — Ambulatory Visit
Admission: RE | Admit: 2015-04-30 | Discharge: 2015-04-30 | Disposition: A | Discharge status: Home / Self Care | Payer: BC Managed Care – PPO | Source: Ambulatory Visit | Attending: Gastroenterology | Admitting: Gastroenterology

## 2015-04-30 DIAGNOSIS — R1084 Generalized abdominal pain: Secondary | ICD-10-CM

## 2015-04-30 MED ORDER — IOPAMIDOL (ISOVUE-300) INJECTION 61%
100.0000 mL | Freq: Once | INTRAVENOUS | Status: AC | PRN
  Administered 2015-04-30: 100 mL via INTRAVENOUS

## 2015-05-04 ENCOUNTER — Other Ambulatory Visit: Payer: No Typology Code available for payment source

## 2015-09-21 ENCOUNTER — Other Ambulatory Visit: Payer: Self-pay | Admitting: Physician Assistant

## 2015-09-21 DIAGNOSIS — R945 Abnormal results of liver function studies: Principal | ICD-10-CM

## 2015-09-21 DIAGNOSIS — R7989 Other specified abnormal findings of blood chemistry: Secondary | ICD-10-CM

## 2015-09-24 ENCOUNTER — Other Ambulatory Visit: Payer: Self-pay | Admitting: Physician Assistant

## 2015-09-24 DIAGNOSIS — R7989 Other specified abnormal findings of blood chemistry: Secondary | ICD-10-CM

## 2015-09-24 DIAGNOSIS — R945 Abnormal results of liver function studies: Principal | ICD-10-CM

## 2015-09-28 ENCOUNTER — Other Ambulatory Visit: Payer: BC Managed Care – PPO

## 2015-09-30 ENCOUNTER — Ambulatory Visit
Admission: RE | Admit: 2015-09-30 | Discharge: 2015-09-30 | Disposition: A | Discharge status: Home / Self Care | Payer: BC Managed Care – PPO | Source: Ambulatory Visit | Attending: Physician Assistant | Admitting: Physician Assistant

## 2015-09-30 DIAGNOSIS — R7989 Other specified abnormal findings of blood chemistry: Secondary | ICD-10-CM

## 2015-09-30 DIAGNOSIS — R945 Abnormal results of liver function studies: Principal | ICD-10-CM

## 2016-09-14 ENCOUNTER — Other Ambulatory Visit: Payer: Self-pay | Admitting: Gastroenterology

## 2016-09-14 DIAGNOSIS — R740 Nonspecific elevation of levels of transaminase and lactic acid dehydrogenase [LDH]: Secondary | ICD-10-CM

## 2016-09-14 DIAGNOSIS — K760 Fatty (change of) liver, not elsewhere classified: Secondary | ICD-10-CM

## 2016-09-14 DIAGNOSIS — R7402 Elevation of levels of lactic acid dehydrogenase (LDH): Secondary | ICD-10-CM

## 2016-09-14 DIAGNOSIS — R7401 Elevation of levels of liver transaminase levels: Secondary | ICD-10-CM

## 2016-09-22 ENCOUNTER — Ambulatory Visit
Admission: RE | Admit: 2016-09-22 | Discharge: 2016-09-22 | Disposition: A | Discharge status: Home / Self Care | Payer: BC Managed Care – PPO | Source: Ambulatory Visit | Attending: Gastroenterology | Admitting: Gastroenterology

## 2016-09-22 DIAGNOSIS — K760 Fatty (change of) liver, not elsewhere classified: Secondary | ICD-10-CM

## 2016-09-22 DIAGNOSIS — R7401 Elevation of levels of liver transaminase levels: Secondary | ICD-10-CM

## 2016-09-22 DIAGNOSIS — R740 Nonspecific elevation of levels of transaminase and lactic acid dehydrogenase [LDH]: Secondary | ICD-10-CM

## 2017-06-05 IMAGING — CT CT ABD-PELV W/ CM
2 of 5 series · 16 of 46 positions shown, 18 images · IV contrast (APPLIED)
Comparison: 11/06/2014

CLINICAL DATA: Generalized abdominal pain. Recurrent
diverticulitis. Approximately 3 years status post Whipple procedure
for benign pancreatic tumor.

EXAM:
CT ABDOMEN AND PELVIS WITH CONTRAST
TECHNIQUE: Multidetector CT imaging of the abdomen and pelvis was performed
using the standard protocol following bolus administration of
intravenous contrast.
CONTRAST:  100mL [id number] IOPAMIDOL ([id number]) INJECTION 61%

[Series 2: abd/pelvis w/cm · axial · 0.82mm/px · z∈[+127,+592]mm · 13 of 105 slices shown, 15 images]
[im 6/105  soft-tissue]
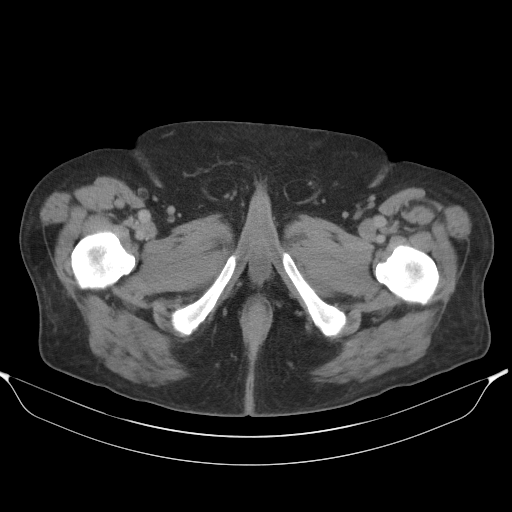
[im 6/105  bone]
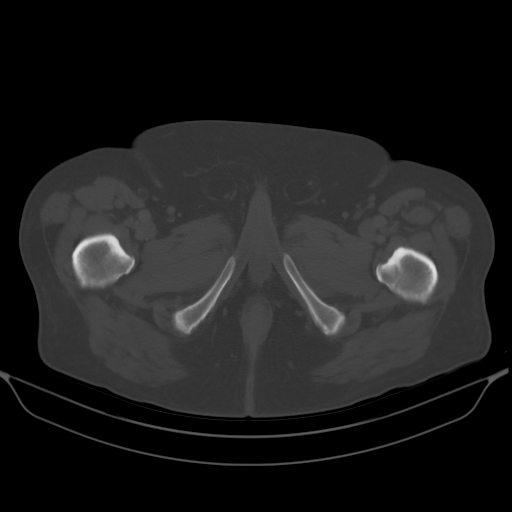
[im 17/105  soft-tissue]
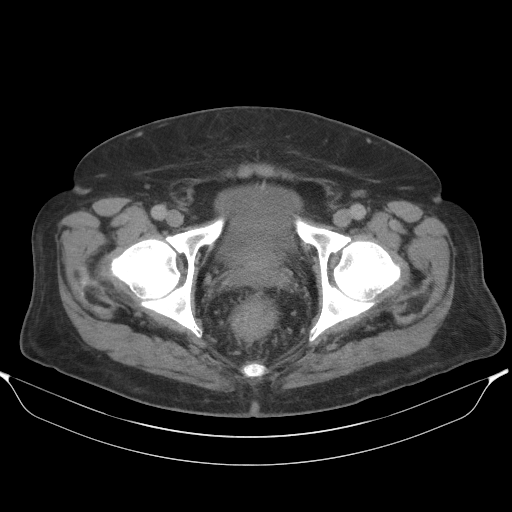
[im 22/105  soft-tissue]
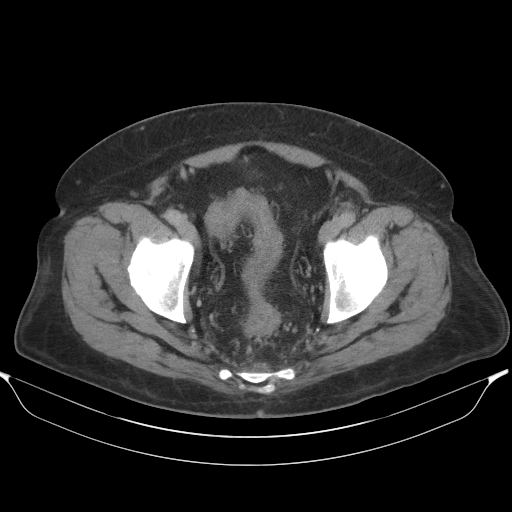
[im 28/105  soft-tissue]
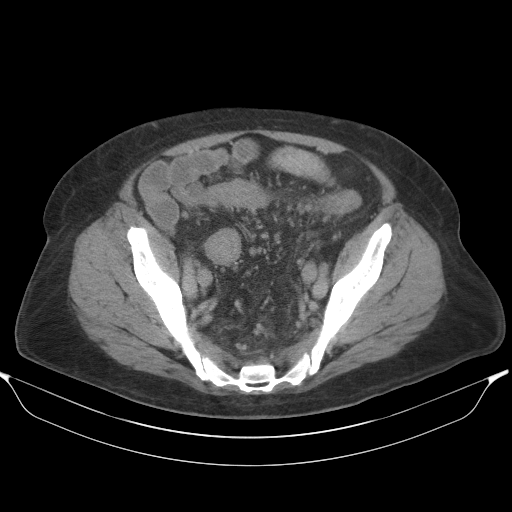
[im 39/105  soft-tissue]
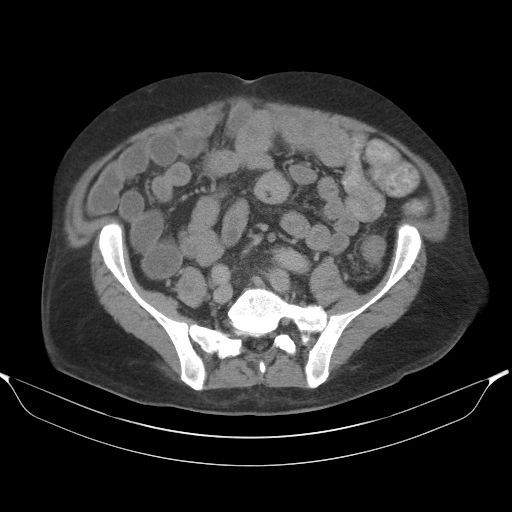
[im 44/105  soft-tissue]
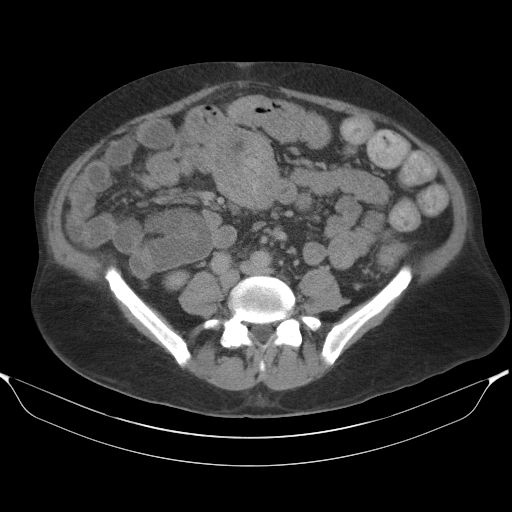
[im 55/105  soft-tissue]
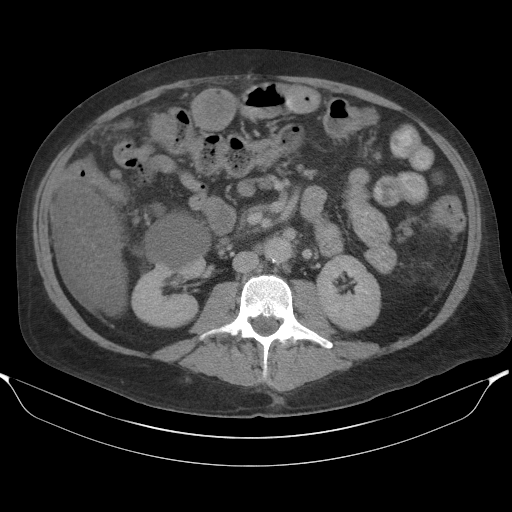
[im 61/105  soft-tissue]
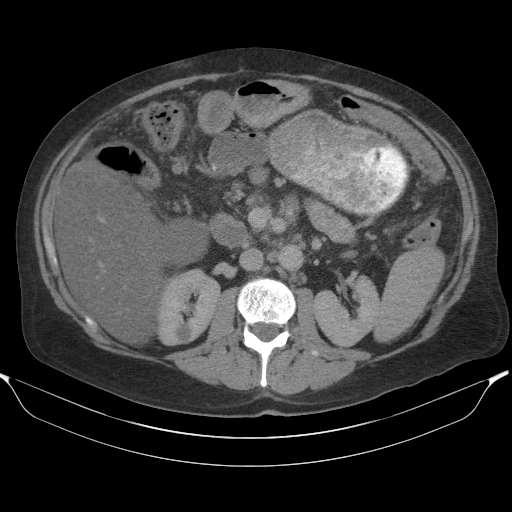
[im 66/105  soft-tissue]
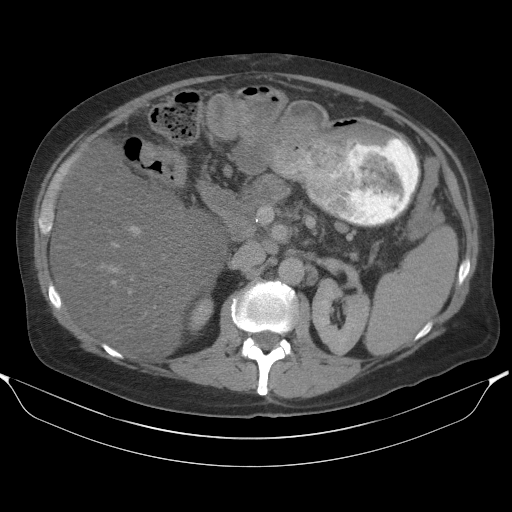
[im 66/105  bone]
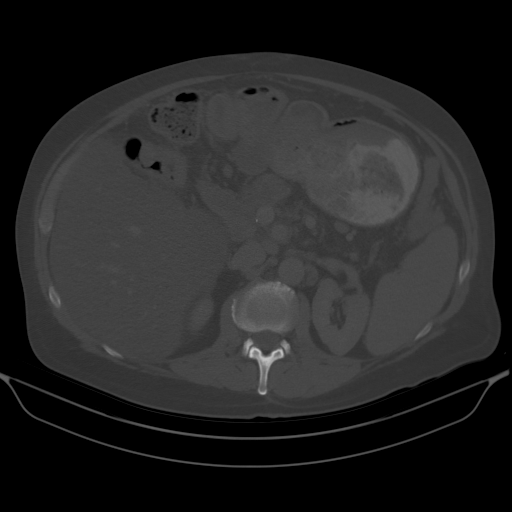
[im 77/105  soft-tissue]
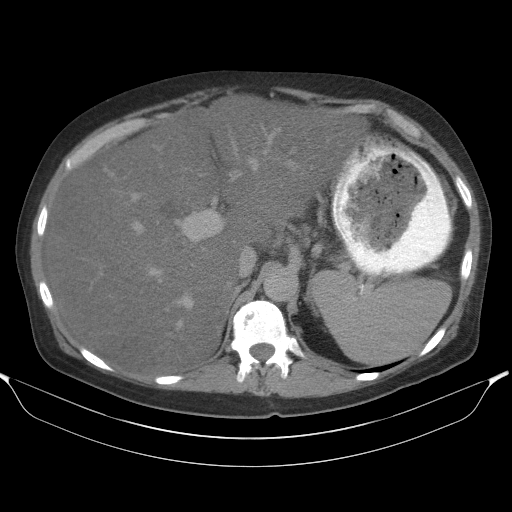
[im 83/105  soft-tissue]
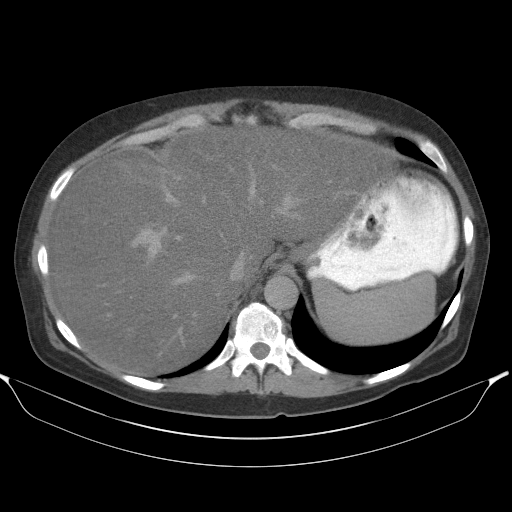
[im 88/105  soft-tissue]
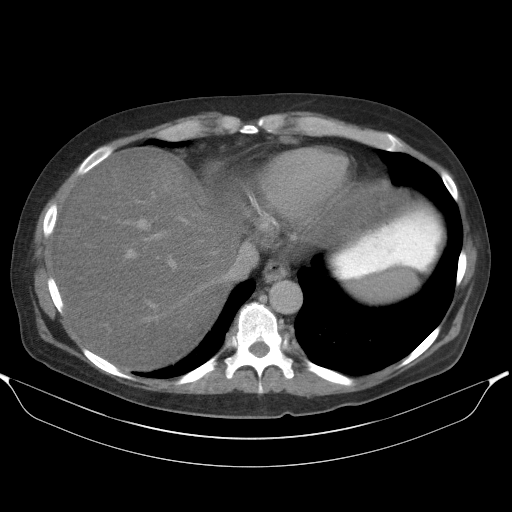
[im 99/105  soft-tissue]
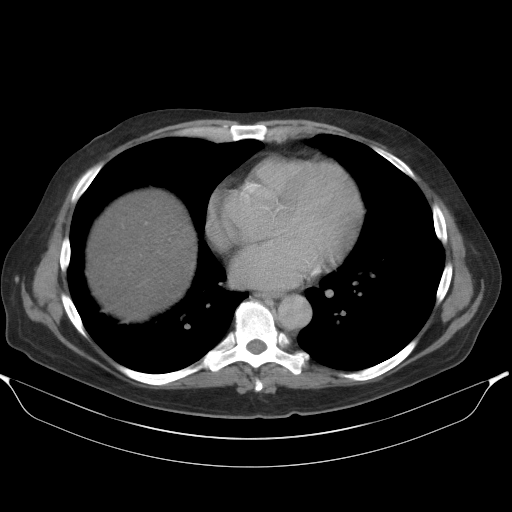

[Series 3: cor · coronal · 0.81mm/px · 3 of 101 slices shown]
[im 34/101  soft-tissue]
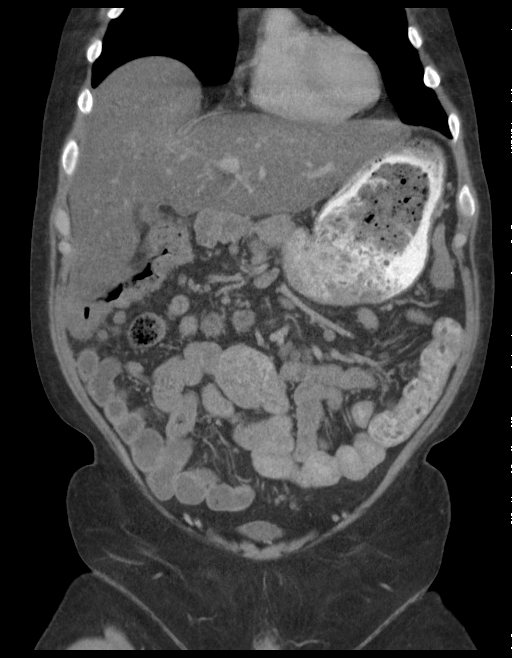
[im 45/101  soft-tissue]
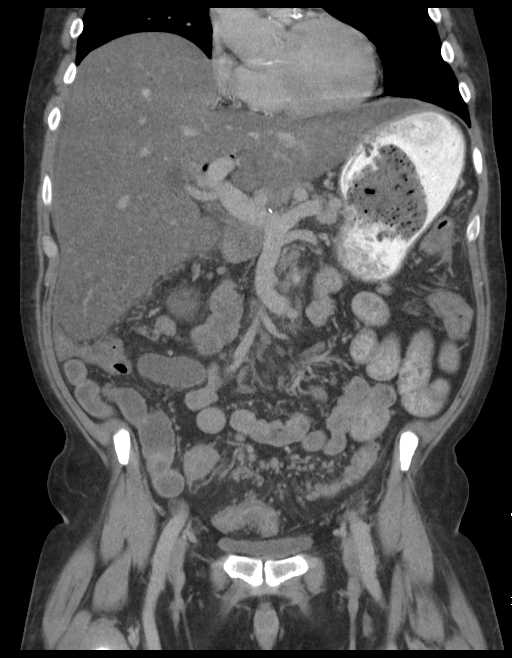
[im 56/101  soft-tissue]
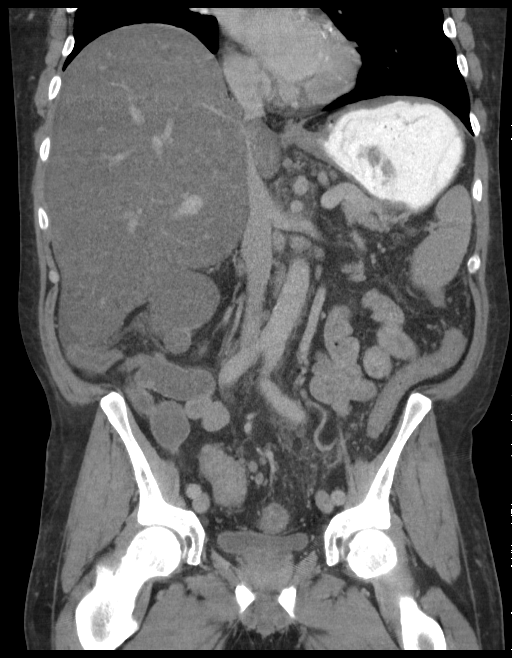

[16 of 46 positions shown; findings below may reference images not displayed]

FINDINGS: Lower chest:  No acute findings.

Hepatobiliary: Severe hepatic steatosis again demonstrated. No liver
masses are identified. Postop changes from prior cholecystectomy and
Whipple procedure again noted.

Pancreas: Prior Whipple procedure. Remainder of pancreatic body and
tail appears normal. No evidence of pancreatic ductal dilatation.

Spleen: Within normal limits in size and appearance.

Adrenals/Urinary Tract: No adrenal masses are identified. Small
bilateral renal cysts remain stable and no definite renal masses are
identified. No evidence of hydronephrosis.

Stomach/Bowel: There is long segment wall thickening involving the
descending and rectosigmoid portions of the colon, similar to
previous study.

Vascular/Lymphatic: There is also numerous shotty less than 1 cm
lymph nodes seen within the sigmoid mesentery and the small bowel
mesentery which shows no significant change. Index lymph node in
central small bowel mesentery measures 12 mm on image 42/ series 2
compared to 14 mm previously. No evidence of abdominal aortic
aneurysm.

Reproductive: No mass or other significant abnormality.

Other: No evidence of ascites.

Musculoskeletal:  No suspicious bone lesions identified.
IMPRESSION: Diffuse wall thickening involving the descending and rectosigmoid
colon, suspicious for colitis. These findings show no significant
change.

Numerous shotty lymph nodes throughout the sigmoid mesocolon and
small bowel mesentery. While these may be reactive in etiology,
lymphoproliferative disorder cannot be excluded.

Stable severe hepatic steatosis. Postop changes from previous
Whipple procedure.

## 2017-11-05 IMAGING — US US ART/VEN ABD/PELV/SCROTUM DOPPLER LTD
1 series · 13 of 25 positions shown · non-contrast
Comparison: CT 04/30/2015 and previous

CLINICAL DATA: Previous pancreatitis. Previous Whipple procedure
7570. Post cholecystectomy. Hepatic steatosis.

EXAM:
COMPLETE ABDOMINAL ULTRASOUND
US HEPATIC DOPPLER ARTERIAL/VENOUS ULTRASOUND

[Series 1: us art/ven abd/pelv/scrotum doppler ltd · 0.32mm/px · 13 of 105 slices shown]
[im 1/105]
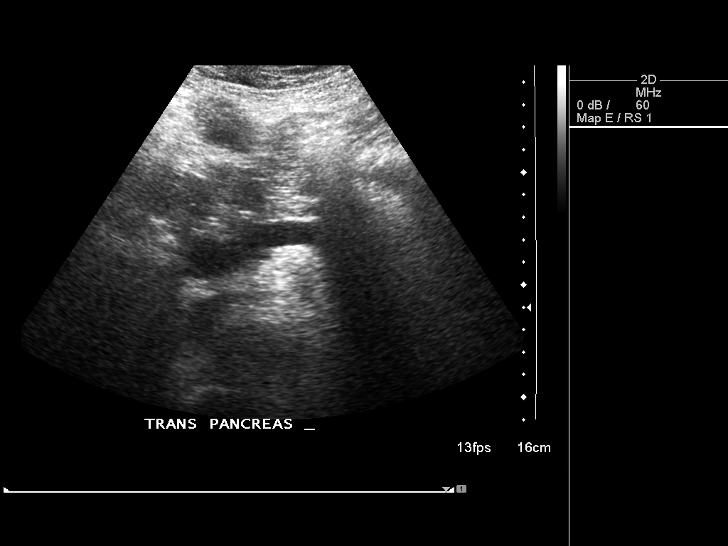
[im 9/105]
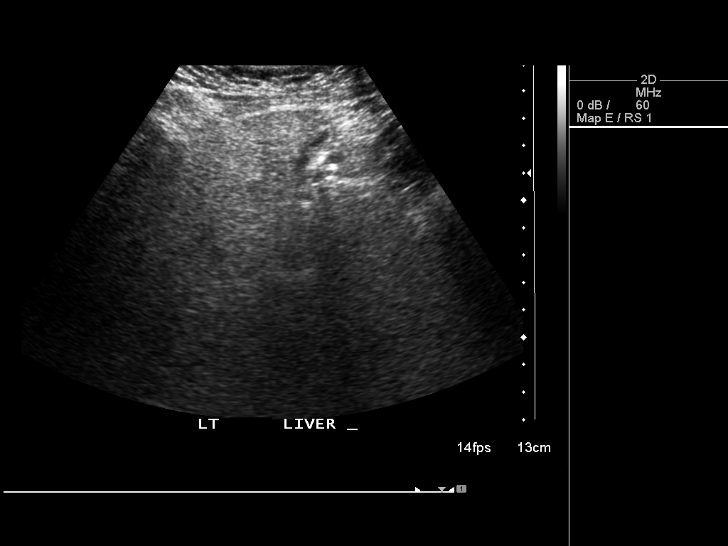
[im 18/105]
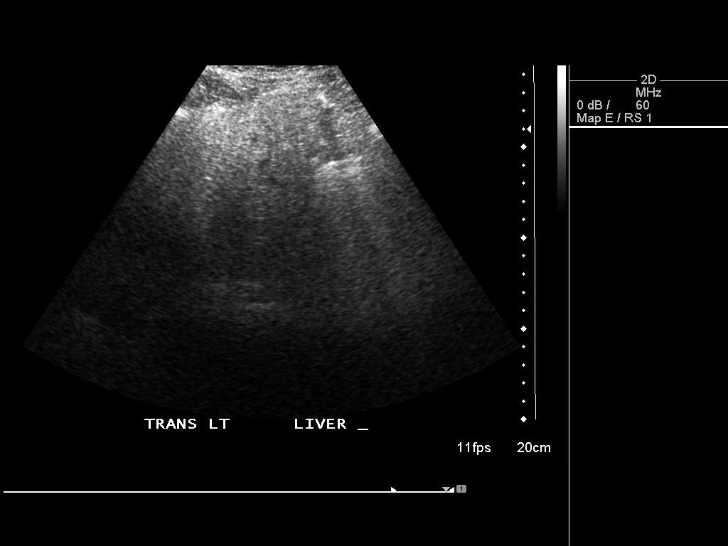
[im 27/105]
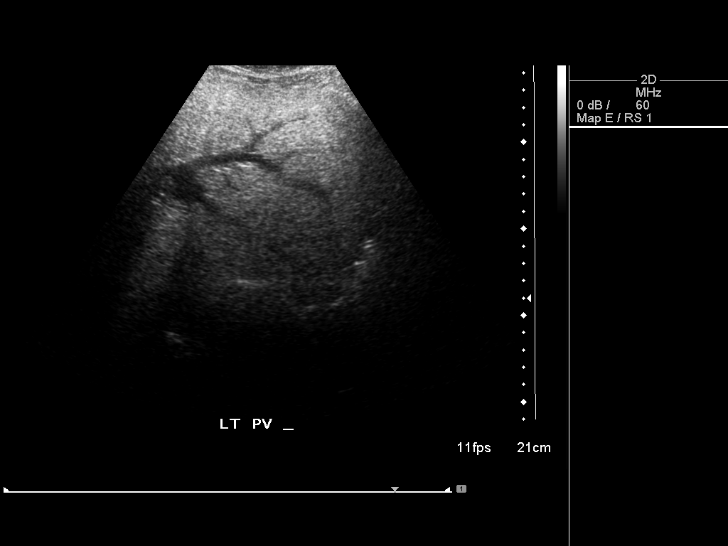
[im 35/105]
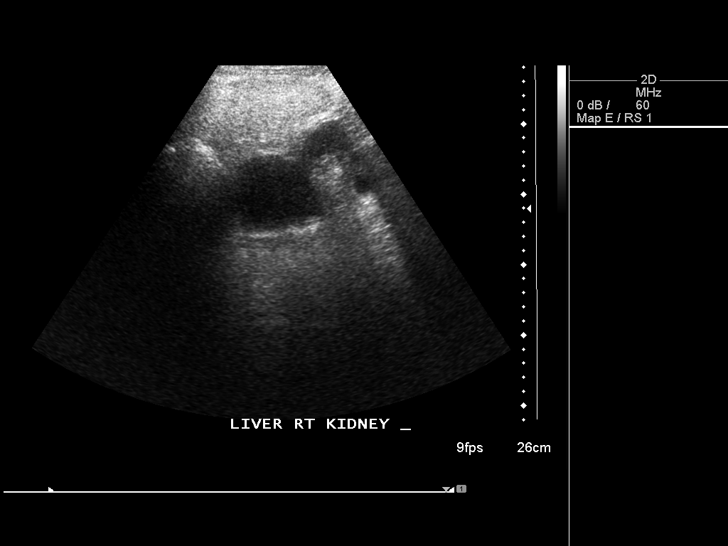
[im 44/105]
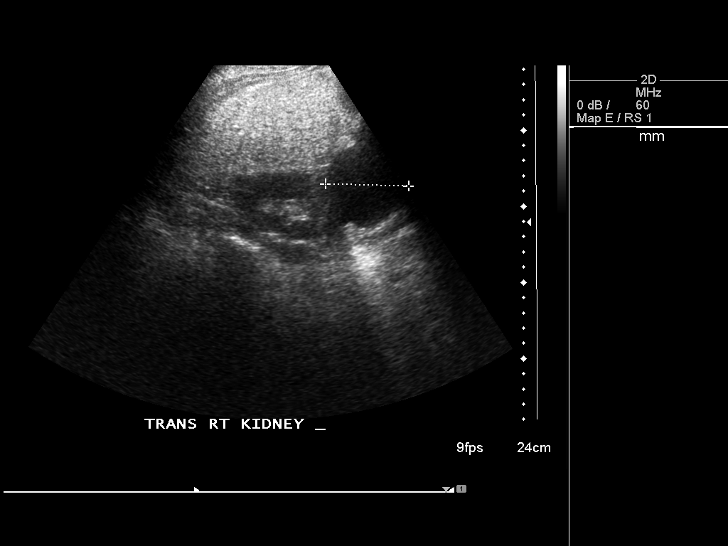
[im 53/105]
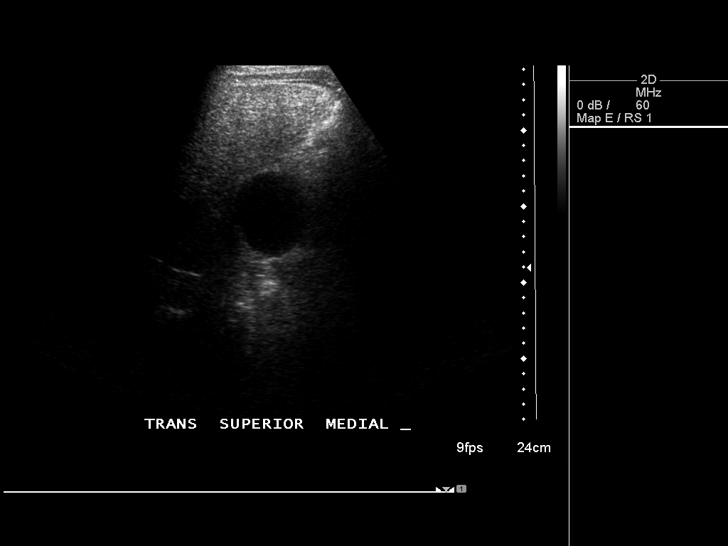
[im 61/105]
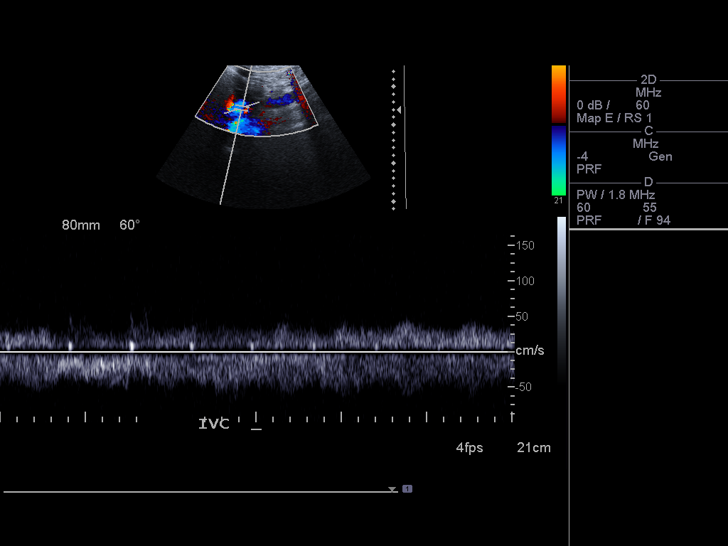
[im 70/105]
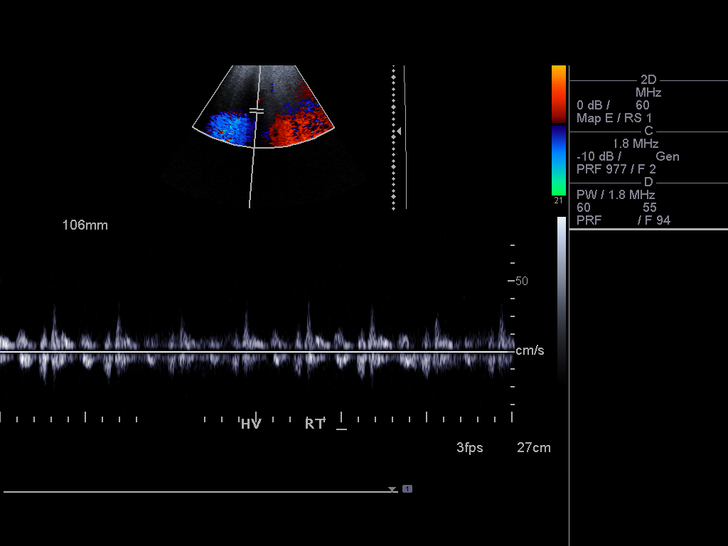
[im 79/105]
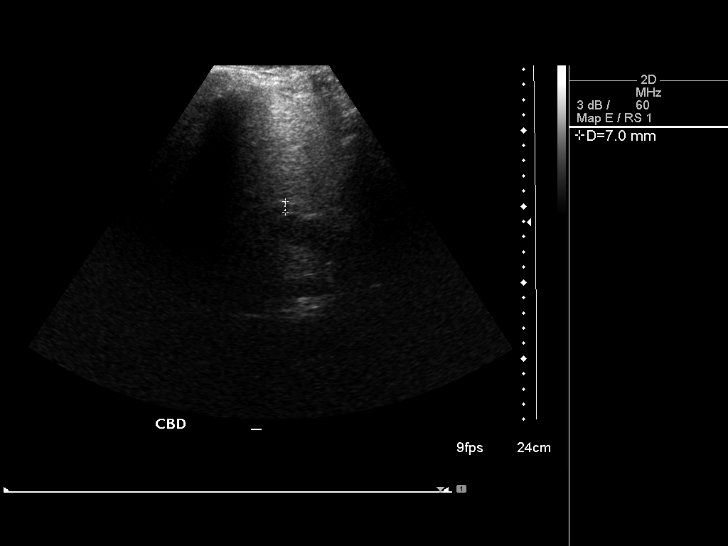
[im 87/105]
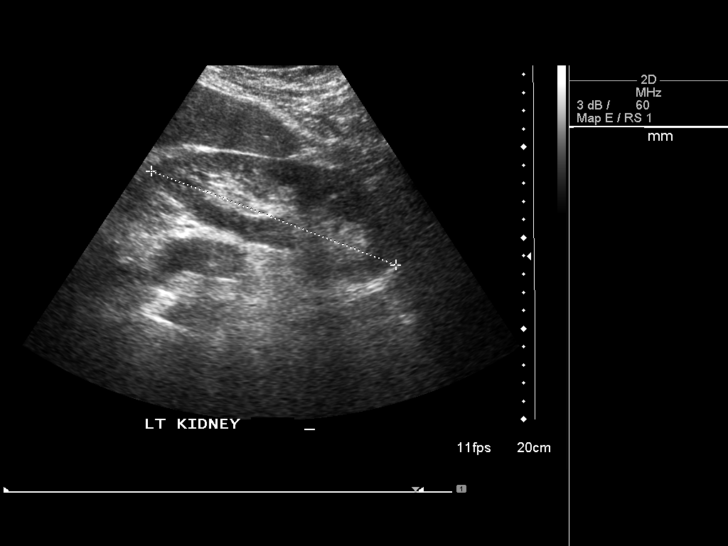
[im 96/105]
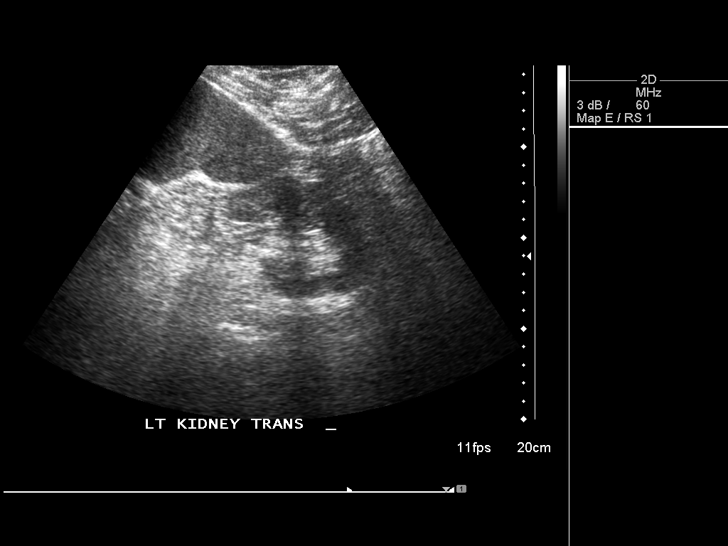
[im 105/105]
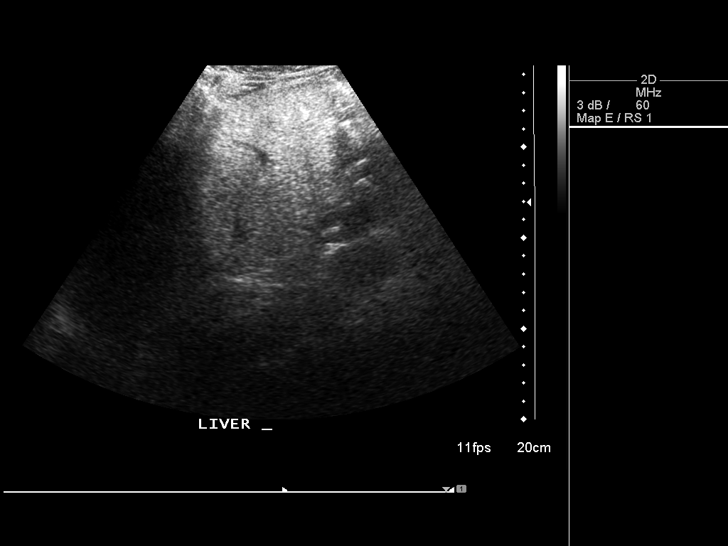

[13 of 25 positions shown; findings below may reference images not displayed]

FINDINGS: Gallbladder:  Surgically absent

Common bile duct:  Normal in caliber, 7mm diameter.

Liver: Coarse echogenic parenchyma difficult to penetrate. There is
a nearly isoechoic border deforming 5 x 3.8 x 4.2 cm solid-appearing
subcapsular lesion in the lateral left hepatic segment.

Portal Vein: No occlusion or thrombus. Velocities (all hepatopetal)

Main:  19-34 cm/sec

Right: 34 cm/sec

Left:  21 cm/sec

Hepatic Vein Velocities (all hepatofugal)

Right:  44 cm/sec

Middle:  17 cm/sec

Left:  20 cm/sec

Hepatic Artery Velocity: 42 cm/sec

IVC:  Largely obscured.

Pancreas: Visualized segments unremarkable, portions obscured by
overlying bowel gas.

Spleen:  No focal lesion, 12.6 x 7.1 x 14 cm (volume = 651.3 cc).

Splenic Vein:  No occlusion or thrombus.  Velocity: 57 cm/sec

Right Kidney: 12.6 cm. Exophytic 2.6 x 2.1 x 2 cm cyst from the
lower pole. No hydronephrosis or solid renal lesion.

Left Kidney: No hydronephrosis, 14.4cm in length. 9 mm hypoechoic
lesion from the upper pole, nonspecific.

Abdominal aorta: Visualized segments unremarkable, portions obscured
by overlying bowel gas.

Varices: None visualized

Ascites: None seen
IMPRESSION: 1. Echogenic liver parenchyma with a nonspecific 5 cm mass suspected
in the lateral left hepatic segment. Recommend MR liver with
contrast for further characterization.
2. Unremarkable hepatic vascular Doppler evaluation.
3. Probable bilateral renal cysts.

## 2018-10-17 IMAGING — US US ABDOMEN LIMITED
1 series · 14 of 25 positions shown · non-contrast
Comparison: Ultrasound 09/30/2015.  CT 04/30/2015.

CLINICAL DATA: Elevated LFTs.

EXAM:
ULTRASOUND ABDOMEN LIMITED RIGHT UPPER QUADRANT

[Series 1: us abdomen limited · 0.30mm/px · 14 of 47 slices shown]
[im 1/47]
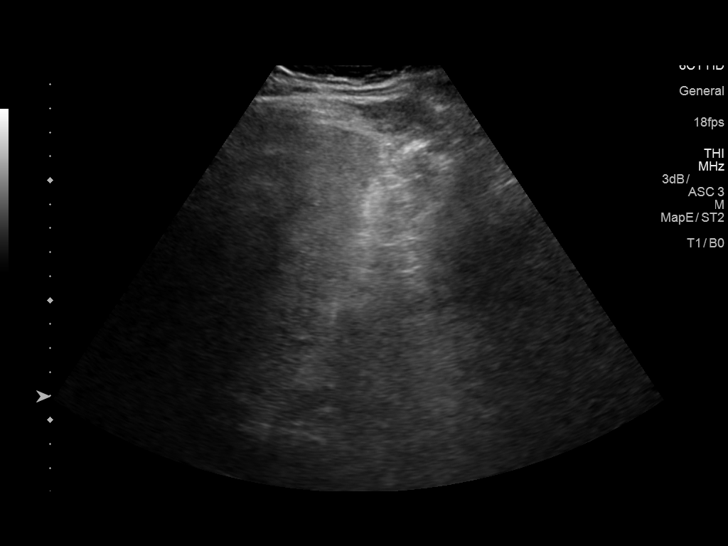
[im 4/47]
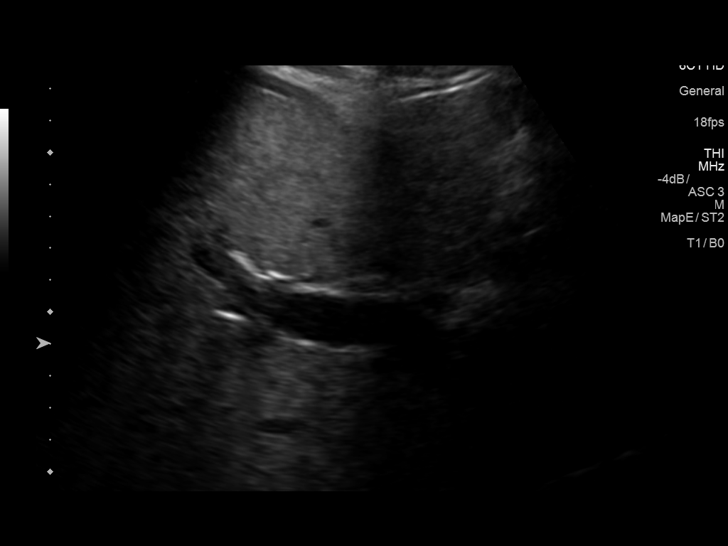
[im 8/47]
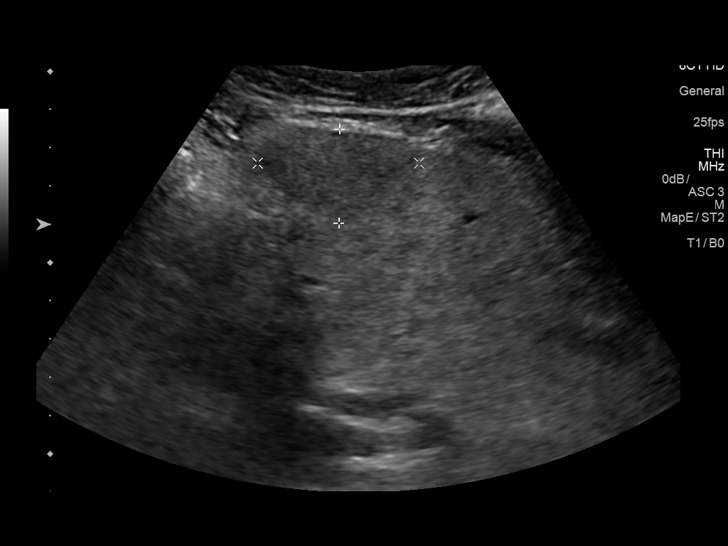
[im 12/47]
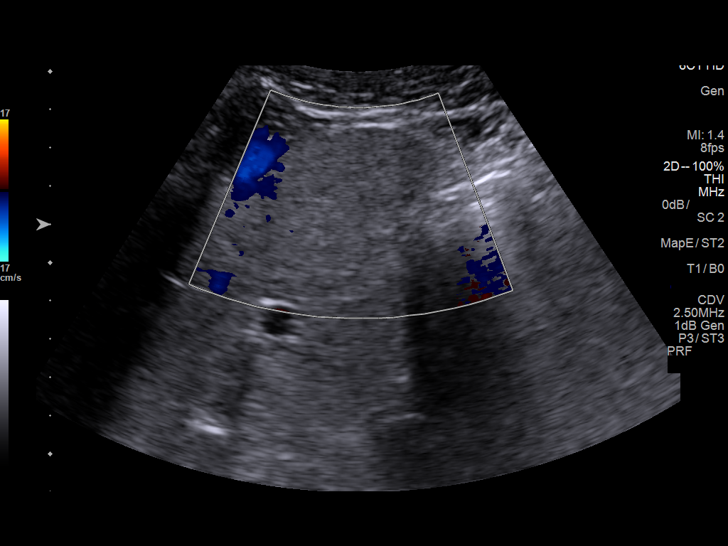
[im 16/47]
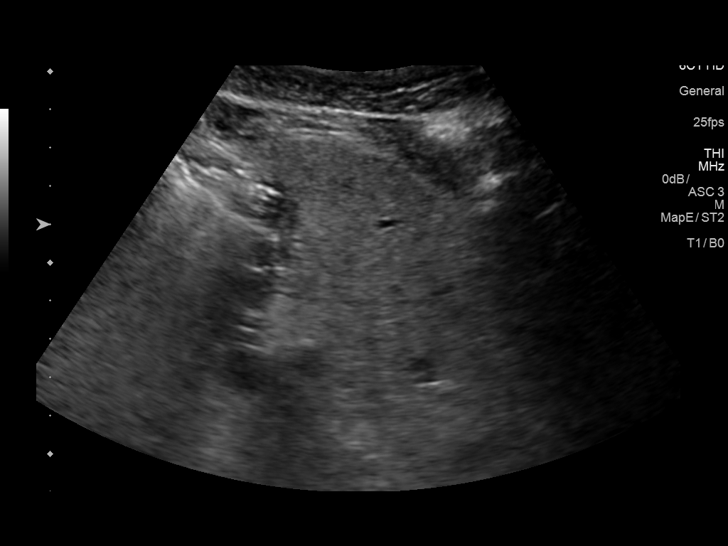
[im 18/47]
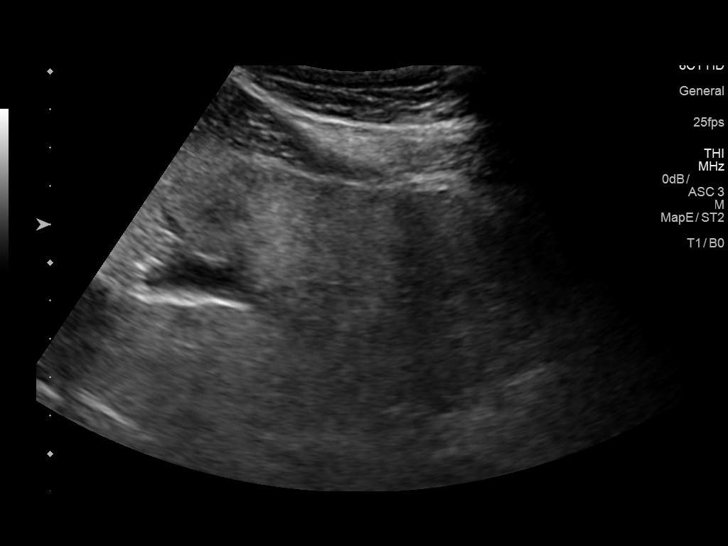
[im 22/47]
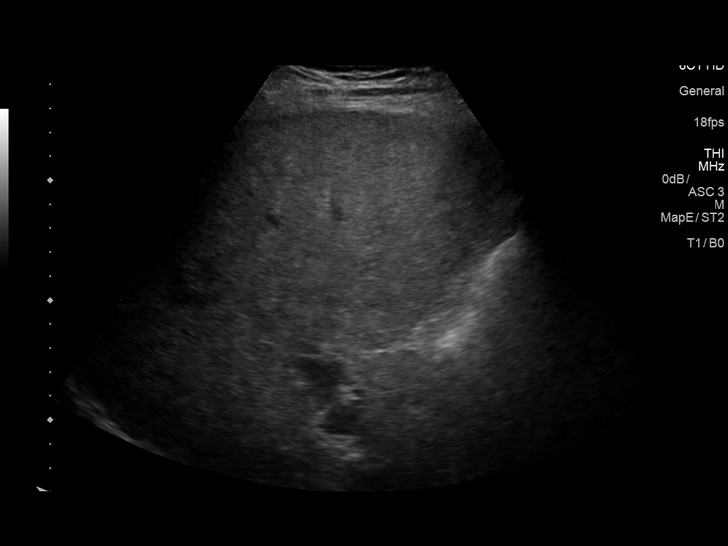
[im 25/47]
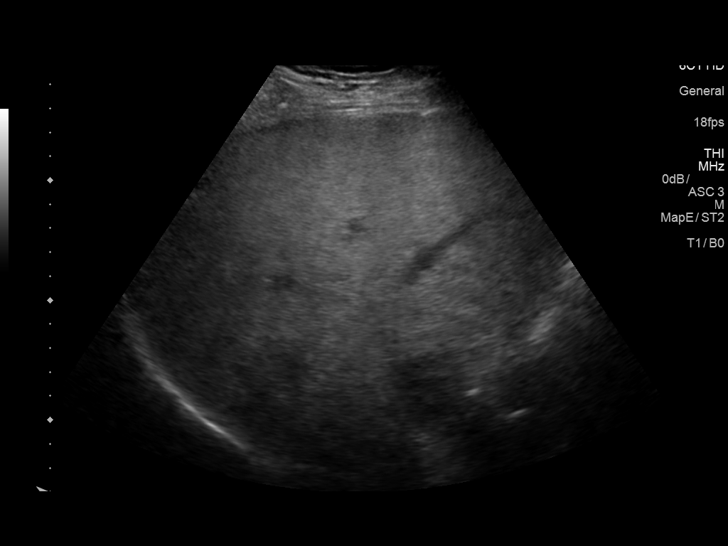
[im 29/47]
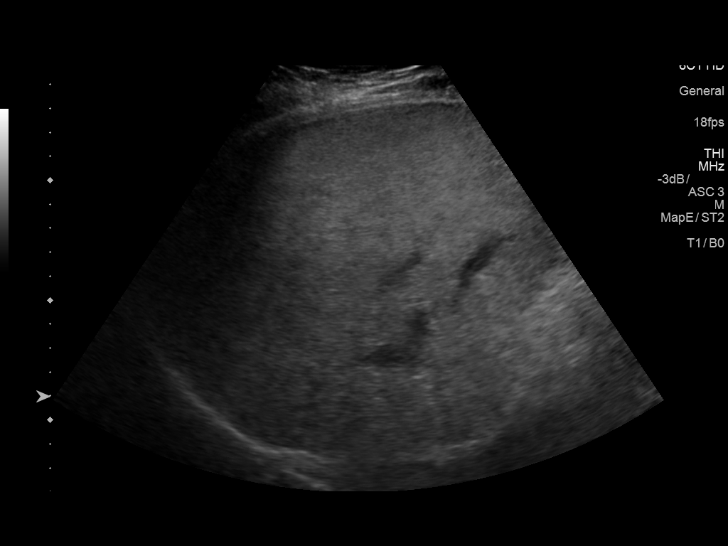
[im 31/47]
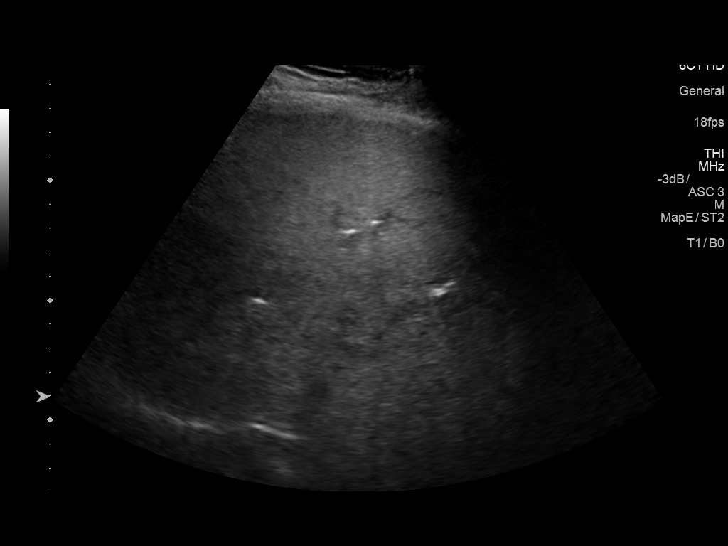
[im 35/47]
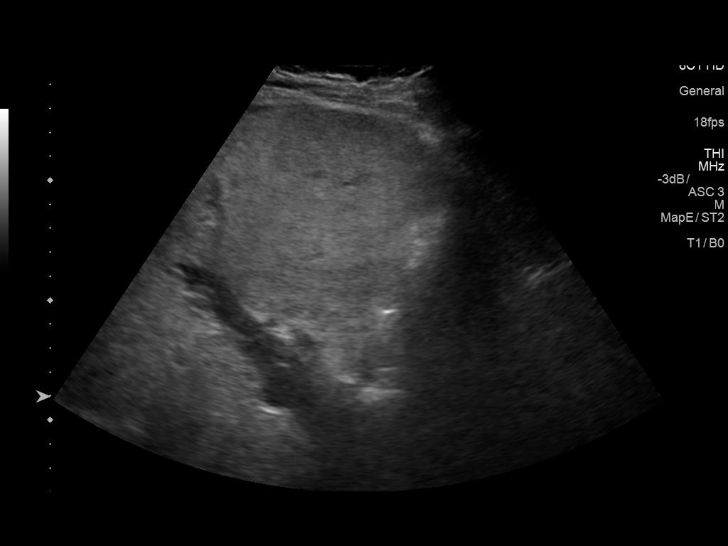
[im 39/47]
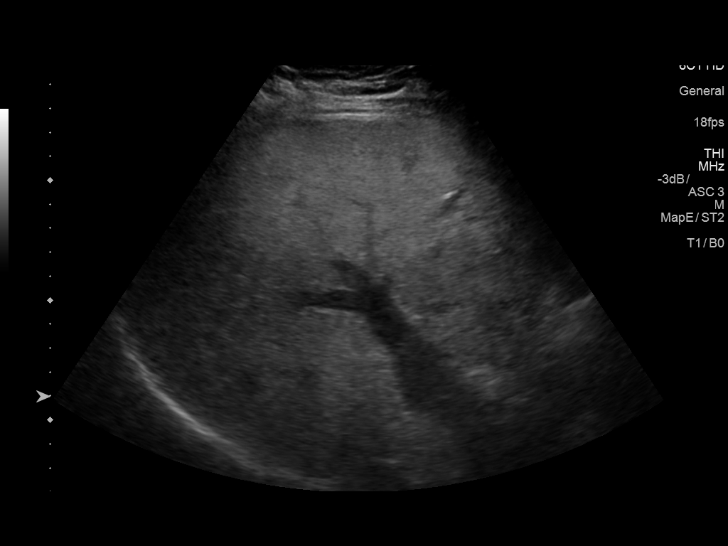
[im 43/47]
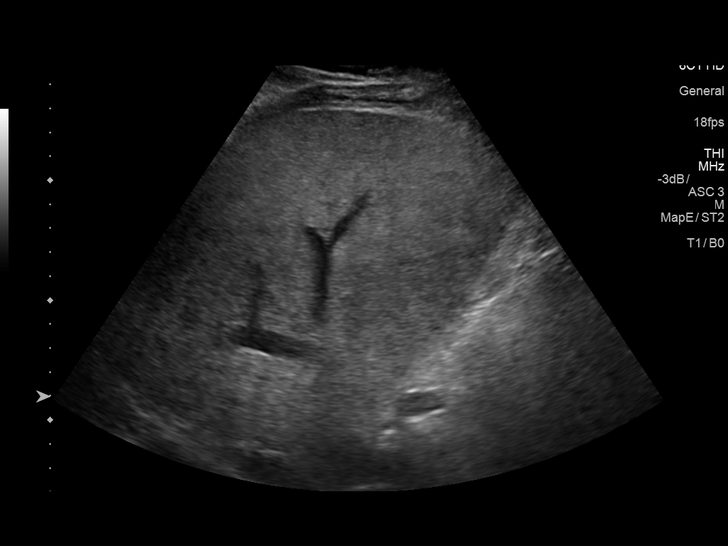
[im 47/47]
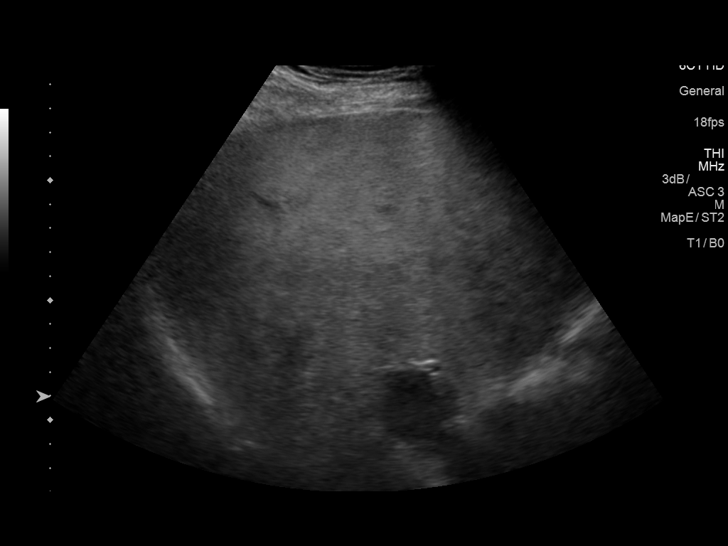

[14 of 25 positions shown; findings below may reference images not displayed]

FINDINGS: Gallbladder:

Cholecystectomy.

Common bile duct:

Diameter: 5.1 mm

Liver:

Increased echogenicity consistent fatty infiltration and/or
hepatocellular disease. 3 3.9 x 2.5 x 4.2 cm mass noted in the
anterior aspect of the liver. Although large area focal fatty
sparing could present this fashion, focal significant mass lesion
cannot be excluded. Similar findings noted on prior ultrasound.
Again gadolinium-enhanced MRI of the liver suggested for further
evaluation .
IMPRESSION: 1. Cholecystectomy.  No biliary distention.

2. Increased hepatic echogenicity consistent with fatty infiltration
and/or hepatocellular disease again noted. A 4.2 cm masses noted in
the anterior aspect of the liver. Although a large area of focal
fatty sparing could present in this fashion, a focal significant
mass lesion cannot be excluded. Similar findings noted on prior
ultrasound. Again gadolinium-enhanced MRI of the liver suggested for
further evaluation.

## 2021-11-12 ENCOUNTER — Other Ambulatory Visit (HOSPITAL_BASED_OUTPATIENT_CLINIC_OR_DEPARTMENT_OTHER): Payer: Self-pay

## 2021-11-12 MED ORDER — OZEMPIC (1 MG/DOSE) 4 MG/3ML ~~LOC~~ SOPN
1.0000 mg | PEN_INJECTOR | SUBCUTANEOUS | 2 refills | Status: AC
  Filled 2021-11-12: qty 9, 84d supply, fill #0
  Filled 2022-04-24: qty 9, 84d supply, fill #1

## 2022-01-19 ENCOUNTER — Other Ambulatory Visit (HOSPITAL_BASED_OUTPATIENT_CLINIC_OR_DEPARTMENT_OTHER): Payer: Self-pay

## 2022-01-19 MED ORDER — OZEMPIC (1 MG/DOSE) 4 MG/3ML ~~LOC~~ SOPN
1.0000 mg | PEN_INJECTOR | SUBCUTANEOUS | 1 refills | Status: DC
  Filled 2022-01-19: qty 9, 84d supply, fill #0
  Filled 2022-07-19: qty 9, 84d supply, fill #1

## 2022-01-21 ENCOUNTER — Other Ambulatory Visit (HOSPITAL_BASED_OUTPATIENT_CLINIC_OR_DEPARTMENT_OTHER): Payer: Self-pay

## 2022-04-25 ENCOUNTER — Other Ambulatory Visit (HOSPITAL_BASED_OUTPATIENT_CLINIC_OR_DEPARTMENT_OTHER): Payer: Self-pay

## 2022-04-25 ENCOUNTER — Other Ambulatory Visit: Payer: Self-pay

## 2022-04-28 ENCOUNTER — Other Ambulatory Visit (HOSPITAL_COMMUNITY): Payer: Self-pay

## 2022-04-29 ENCOUNTER — Other Ambulatory Visit (HOSPITAL_BASED_OUTPATIENT_CLINIC_OR_DEPARTMENT_OTHER): Payer: Self-pay

## 2022-05-06 ENCOUNTER — Other Ambulatory Visit (HOSPITAL_BASED_OUTPATIENT_CLINIC_OR_DEPARTMENT_OTHER): Payer: Self-pay

## 2022-07-19 ENCOUNTER — Other Ambulatory Visit (HOSPITAL_BASED_OUTPATIENT_CLINIC_OR_DEPARTMENT_OTHER): Payer: Self-pay

## 2022-08-29 ENCOUNTER — Other Ambulatory Visit (HOSPITAL_COMMUNITY): Payer: Self-pay

## 2022-09-30 ENCOUNTER — Other Ambulatory Visit: Payer: Self-pay

## 2022-10-12 ENCOUNTER — Other Ambulatory Visit (HOSPITAL_BASED_OUTPATIENT_CLINIC_OR_DEPARTMENT_OTHER): Payer: Self-pay

## 2022-10-12 MED ORDER — OZEMPIC (1 MG/DOSE) 4 MG/3ML ~~LOC~~ SOPN
1.0000 mg | PEN_INJECTOR | SUBCUTANEOUS | 1 refills | Status: DC
  Filled 2022-10-12: qty 9, 84d supply, fill #0
  Filled 2023-01-02 – 2023-01-09 (×2): qty 9, 84d supply, fill #1

## 2022-10-15 ENCOUNTER — Other Ambulatory Visit (HOSPITAL_BASED_OUTPATIENT_CLINIC_OR_DEPARTMENT_OTHER): Payer: Self-pay

## 2023-01-02 ENCOUNTER — Other Ambulatory Visit (HOSPITAL_BASED_OUTPATIENT_CLINIC_OR_DEPARTMENT_OTHER): Payer: Self-pay

## 2023-01-03 ENCOUNTER — Other Ambulatory Visit (HOSPITAL_BASED_OUTPATIENT_CLINIC_OR_DEPARTMENT_OTHER): Payer: Self-pay

## 2023-01-09 ENCOUNTER — Other Ambulatory Visit (HOSPITAL_BASED_OUTPATIENT_CLINIC_OR_DEPARTMENT_OTHER): Payer: Self-pay

## 2023-03-29 ENCOUNTER — Other Ambulatory Visit: Payer: Self-pay

## 2023-03-29 ENCOUNTER — Other Ambulatory Visit (HOSPITAL_COMMUNITY): Payer: Self-pay

## 2023-03-29 ENCOUNTER — Other Ambulatory Visit (HOSPITAL_BASED_OUTPATIENT_CLINIC_OR_DEPARTMENT_OTHER): Payer: Self-pay

## 2023-03-29 ENCOUNTER — Encounter (HOSPITAL_COMMUNITY): Payer: Self-pay

## 2023-03-29 MED ORDER — SEMAGLUTIDE (1 MG/DOSE) 4 MG/3ML ~~LOC~~ SOPN
1.0000 mg | PEN_INJECTOR | SUBCUTANEOUS | 1 refills | Status: AC
  Filled 2023-03-29: qty 9, 84d supply, fill #0
  Filled 2023-07-01: qty 9, 84d supply, fill #1

## 2023-03-30 ENCOUNTER — Other Ambulatory Visit (HOSPITAL_COMMUNITY): Payer: Self-pay

## 2023-04-04 ENCOUNTER — Other Ambulatory Visit (HOSPITAL_COMMUNITY): Payer: Self-pay

## 2023-04-04 ENCOUNTER — Other Ambulatory Visit (HOSPITAL_BASED_OUTPATIENT_CLINIC_OR_DEPARTMENT_OTHER): Payer: Self-pay

## 2023-04-04 ENCOUNTER — Other Ambulatory Visit: Payer: Self-pay

## 2023-06-26 ENCOUNTER — Other Ambulatory Visit (HOSPITAL_BASED_OUTPATIENT_CLINIC_OR_DEPARTMENT_OTHER): Payer: Self-pay

## 2023-07-01 ENCOUNTER — Other Ambulatory Visit (HOSPITAL_COMMUNITY): Payer: Self-pay

## 2023-07-01 ENCOUNTER — Other Ambulatory Visit (HOSPITAL_BASED_OUTPATIENT_CLINIC_OR_DEPARTMENT_OTHER): Payer: Self-pay

## 2023-07-03 ENCOUNTER — Other Ambulatory Visit (HOSPITAL_COMMUNITY): Payer: Self-pay
